# Patient Record
Sex: Female | Born: 1955
Health system: Southern US, Community
[De-identification: ages and names within clinical notes are randomized; demographics above are authoritative.]

## PROBLEM LIST (undated history)

## (undated) DIAGNOSIS — K631 Perforation of intestine (nontraumatic): Secondary | ICD-10-CM

## (undated) DIAGNOSIS — C801 Malignant (primary) neoplasm, unspecified: Secondary | ICD-10-CM

## (undated) DIAGNOSIS — J45909 Unspecified asthma, uncomplicated: Secondary | ICD-10-CM

## (undated) DIAGNOSIS — T7840XA Allergy, unspecified, initial encounter: Secondary | ICD-10-CM

## (undated) DIAGNOSIS — M8580 Other specified disorders of bone density and structure, unspecified site: Secondary | ICD-10-CM

## (undated) HISTORY — DX: Perforation of intestine (nontraumatic): K63.1

## (undated) HISTORY — PX: LUNG REMOVAL, PARTIAL: SHX233

## (undated) HISTORY — DX: Other specified disorders of bone density and structure, unspecified site: M85.80

## (undated) HISTORY — DX: Allergy, unspecified, initial encounter: T78.40XA

## (undated) HISTORY — PX: DILATION AND CURETTAGE OF UTERUS: SHX78

## (undated) HISTORY — DX: Malignant (primary) neoplasm, unspecified: C80.1

## (undated) HISTORY — PX: COLON SURGERY: SHX602

## (undated) HISTORY — PX: APPENDECTOMY: SHX54

## (undated) HISTORY — DX: Unspecified asthma, uncomplicated: J45.909

## (undated) HISTORY — PX: COLON RESECTION: SHX5231

---

## 1999-12-21 ENCOUNTER — Encounter: Admission: RE | Admit: 1999-12-21 | Discharge: 1999-12-21 | Payer: Self-pay | Admitting: Oncology

## 1999-12-21 ENCOUNTER — Encounter: Payer: Self-pay | Admitting: Oncology

## 2011-09-12 DIAGNOSIS — C20 Malignant neoplasm of rectum: Secondary | ICD-10-CM | POA: Insufficient documentation

## 2011-09-12 DIAGNOSIS — K635 Polyp of colon: Secondary | ICD-10-CM | POA: Insufficient documentation

## 2013-01-20 DIAGNOSIS — N952 Postmenopausal atrophic vaginitis: Secondary | ICD-10-CM | POA: Insufficient documentation

## 2013-08-19 DIAGNOSIS — C7800 Secondary malignant neoplasm of unspecified lung: Secondary | ICD-10-CM | POA: Insufficient documentation

## 2015-12-29 DIAGNOSIS — D126 Benign neoplasm of colon, unspecified: Secondary | ICD-10-CM | POA: Diagnosis not present

## 2015-12-29 DIAGNOSIS — C20 Malignant neoplasm of rectum: Secondary | ICD-10-CM | POA: Diagnosis not present

## 2015-12-29 DIAGNOSIS — W888XXS Exposure to other ionizing radiation, sequela: Secondary | ICD-10-CM | POA: Diagnosis not present

## 2015-12-29 DIAGNOSIS — Z1211 Encounter for screening for malignant neoplasm of colon: Secondary | ICD-10-CM | POA: Diagnosis not present

## 2015-12-29 DIAGNOSIS — K635 Polyp of colon: Secondary | ICD-10-CM | POA: Diagnosis not present

## 2015-12-29 DIAGNOSIS — Z85038 Personal history of other malignant neoplasm of large intestine: Secondary | ICD-10-CM | POA: Diagnosis not present

## 2015-12-29 DIAGNOSIS — D123 Benign neoplasm of transverse colon: Secondary | ICD-10-CM | POA: Diagnosis not present

## 2015-12-29 DIAGNOSIS — Z86010 Personal history of colonic polyps: Secondary | ICD-10-CM | POA: Diagnosis not present

## 2015-12-29 DIAGNOSIS — Z79899 Other long term (current) drug therapy: Secondary | ICD-10-CM | POA: Diagnosis not present

## 2015-12-29 DIAGNOSIS — K520 Gastroenteritis and colitis due to radiation: Secondary | ICD-10-CM | POA: Diagnosis not present

## 2016-01-04 DIAGNOSIS — R05 Cough: Secondary | ICD-10-CM | POA: Diagnosis not present

## 2016-01-04 DIAGNOSIS — J302 Other seasonal allergic rhinitis: Secondary | ICD-10-CM | POA: Diagnosis not present

## 2016-01-04 DIAGNOSIS — J019 Acute sinusitis, unspecified: Secondary | ICD-10-CM | POA: Diagnosis not present

## 2016-01-04 DIAGNOSIS — Z6824 Body mass index (BMI) 24.0-24.9, adult: Secondary | ICD-10-CM | POA: Diagnosis not present

## 2016-01-11 DIAGNOSIS — R05 Cough: Secondary | ICD-10-CM | POA: Diagnosis not present

## 2016-01-11 DIAGNOSIS — J309 Allergic rhinitis, unspecified: Secondary | ICD-10-CM | POA: Diagnosis not present

## 2016-01-11 DIAGNOSIS — Z1389 Encounter for screening for other disorder: Secondary | ICD-10-CM | POA: Diagnosis not present

## 2016-01-11 DIAGNOSIS — Z6825 Body mass index (BMI) 25.0-25.9, adult: Secondary | ICD-10-CM | POA: Diagnosis not present

## 2016-01-11 DIAGNOSIS — J302 Other seasonal allergic rhinitis: Secondary | ICD-10-CM | POA: Diagnosis not present

## 2016-01-17 DIAGNOSIS — R05 Cough: Secondary | ICD-10-CM | POA: Diagnosis not present

## 2016-01-17 DIAGNOSIS — Z1389 Encounter for screening for other disorder: Secondary | ICD-10-CM | POA: Diagnosis not present

## 2016-01-17 DIAGNOSIS — Z6825 Body mass index (BMI) 25.0-25.9, adult: Secondary | ICD-10-CM | POA: Diagnosis not present

## 2016-01-18 DIAGNOSIS — M5416 Radiculopathy, lumbar region: Secondary | ICD-10-CM | POA: Diagnosis not present

## 2016-01-18 DIAGNOSIS — M9905 Segmental and somatic dysfunction of pelvic region: Secondary | ICD-10-CM | POA: Diagnosis not present

## 2016-01-18 DIAGNOSIS — M9902 Segmental and somatic dysfunction of thoracic region: Secondary | ICD-10-CM | POA: Diagnosis not present

## 2016-01-18 DIAGNOSIS — M9903 Segmental and somatic dysfunction of lumbar region: Secondary | ICD-10-CM | POA: Diagnosis not present

## 2016-02-03 DIAGNOSIS — R05 Cough: Secondary | ICD-10-CM | POA: Diagnosis not present

## 2016-02-07 DIAGNOSIS — Z201 Contact with and (suspected) exposure to tuberculosis: Secondary | ICD-10-CM | POA: Diagnosis not present

## 2016-02-24 DIAGNOSIS — M9905 Segmental and somatic dysfunction of pelvic region: Secondary | ICD-10-CM | POA: Diagnosis not present

## 2016-02-24 DIAGNOSIS — M5416 Radiculopathy, lumbar region: Secondary | ICD-10-CM | POA: Diagnosis not present

## 2016-02-24 DIAGNOSIS — M9902 Segmental and somatic dysfunction of thoracic region: Secondary | ICD-10-CM | POA: Diagnosis not present

## 2016-02-24 DIAGNOSIS — M9903 Segmental and somatic dysfunction of lumbar region: Secondary | ICD-10-CM | POA: Diagnosis not present

## 2016-03-10 DIAGNOSIS — Z1231 Encounter for screening mammogram for malignant neoplasm of breast: Secondary | ICD-10-CM | POA: Diagnosis not present

## 2016-03-10 DIAGNOSIS — H5213 Myopia, bilateral: Secondary | ICD-10-CM | POA: Diagnosis not present

## 2016-04-13 DIAGNOSIS — Z79899 Other long term (current) drug therapy: Secondary | ICD-10-CM | POA: Diagnosis not present

## 2016-04-13 DIAGNOSIS — R05 Cough: Secondary | ICD-10-CM | POA: Diagnosis not present

## 2016-04-14 DIAGNOSIS — R05 Cough: Secondary | ICD-10-CM | POA: Diagnosis not present

## 2016-04-18 DIAGNOSIS — M9905 Segmental and somatic dysfunction of pelvic region: Secondary | ICD-10-CM | POA: Diagnosis not present

## 2016-04-18 DIAGNOSIS — M5416 Radiculopathy, lumbar region: Secondary | ICD-10-CM | POA: Diagnosis not present

## 2016-04-18 DIAGNOSIS — M9902 Segmental and somatic dysfunction of thoracic region: Secondary | ICD-10-CM | POA: Diagnosis not present

## 2016-04-18 DIAGNOSIS — M9903 Segmental and somatic dysfunction of lumbar region: Secondary | ICD-10-CM | POA: Diagnosis not present

## 2016-05-23 DIAGNOSIS — M9903 Segmental and somatic dysfunction of lumbar region: Secondary | ICD-10-CM | POA: Diagnosis not present

## 2016-05-23 DIAGNOSIS — M9902 Segmental and somatic dysfunction of thoracic region: Secondary | ICD-10-CM | POA: Diagnosis not present

## 2016-05-23 DIAGNOSIS — M9905 Segmental and somatic dysfunction of pelvic region: Secondary | ICD-10-CM | POA: Diagnosis not present

## 2016-05-23 DIAGNOSIS — M5416 Radiculopathy, lumbar region: Secondary | ICD-10-CM | POA: Diagnosis not present

## 2016-05-25 DIAGNOSIS — R05 Cough: Secondary | ICD-10-CM | POA: Diagnosis not present

## 2016-05-25 DIAGNOSIS — R053 Chronic cough: Secondary | ICD-10-CM | POA: Insufficient documentation

## 2016-06-13 DIAGNOSIS — M5416 Radiculopathy, lumbar region: Secondary | ICD-10-CM | POA: Diagnosis not present

## 2016-06-13 DIAGNOSIS — M9905 Segmental and somatic dysfunction of pelvic region: Secondary | ICD-10-CM | POA: Diagnosis not present

## 2016-06-13 DIAGNOSIS — M9902 Segmental and somatic dysfunction of thoracic region: Secondary | ICD-10-CM | POA: Diagnosis not present

## 2016-06-13 DIAGNOSIS — M9903 Segmental and somatic dysfunction of lumbar region: Secondary | ICD-10-CM | POA: Diagnosis not present

## 2016-07-07 DIAGNOSIS — Z23 Encounter for immunization: Secondary | ICD-10-CM | POA: Diagnosis not present

## 2016-07-10 DIAGNOSIS — R5383 Other fatigue: Secondary | ICD-10-CM | POA: Diagnosis not present

## 2016-07-10 DIAGNOSIS — R05 Cough: Secondary | ICD-10-CM | POA: Diagnosis not present

## 2016-07-10 DIAGNOSIS — J383 Other diseases of vocal cords: Secondary | ICD-10-CM | POA: Diagnosis not present

## 2016-07-10 DIAGNOSIS — R490 Dysphonia: Secondary | ICD-10-CM | POA: Diagnosis not present

## 2016-07-25 DIAGNOSIS — M9905 Segmental and somatic dysfunction of pelvic region: Secondary | ICD-10-CM | POA: Diagnosis not present

## 2016-07-25 DIAGNOSIS — M9902 Segmental and somatic dysfunction of thoracic region: Secondary | ICD-10-CM | POA: Diagnosis not present

## 2016-07-25 DIAGNOSIS — M5416 Radiculopathy, lumbar region: Secondary | ICD-10-CM | POA: Diagnosis not present

## 2016-07-25 DIAGNOSIS — M9903 Segmental and somatic dysfunction of lumbar region: Secondary | ICD-10-CM | POA: Diagnosis not present

## 2016-08-02 DIAGNOSIS — R490 Dysphonia: Secondary | ICD-10-CM | POA: Diagnosis not present

## 2016-08-02 DIAGNOSIS — J383 Other diseases of vocal cords: Secondary | ICD-10-CM | POA: Diagnosis not present

## 2016-08-22 DIAGNOSIS — R490 Dysphonia: Secondary | ICD-10-CM | POA: Diagnosis not present

## 2016-08-22 DIAGNOSIS — J383 Other diseases of vocal cords: Secondary | ICD-10-CM | POA: Diagnosis not present

## 2016-09-05 DIAGNOSIS — M9902 Segmental and somatic dysfunction of thoracic region: Secondary | ICD-10-CM | POA: Diagnosis not present

## 2016-09-05 DIAGNOSIS — M5416 Radiculopathy, lumbar region: Secondary | ICD-10-CM | POA: Diagnosis not present

## 2016-09-05 DIAGNOSIS — M9903 Segmental and somatic dysfunction of lumbar region: Secondary | ICD-10-CM | POA: Diagnosis not present

## 2016-09-05 DIAGNOSIS — M9905 Segmental and somatic dysfunction of pelvic region: Secondary | ICD-10-CM | POA: Diagnosis not present

## 2016-11-02 DIAGNOSIS — M9902 Segmental and somatic dysfunction of thoracic region: Secondary | ICD-10-CM | POA: Diagnosis not present

## 2016-11-02 DIAGNOSIS — M5416 Radiculopathy, lumbar region: Secondary | ICD-10-CM | POA: Diagnosis not present

## 2016-11-02 DIAGNOSIS — M9903 Segmental and somatic dysfunction of lumbar region: Secondary | ICD-10-CM | POA: Diagnosis not present

## 2016-11-02 DIAGNOSIS — M9905 Segmental and somatic dysfunction of pelvic region: Secondary | ICD-10-CM | POA: Diagnosis not present

## 2016-11-22 DIAGNOSIS — R0602 Shortness of breath: Secondary | ICD-10-CM | POA: Diagnosis not present

## 2016-11-22 DIAGNOSIS — J45909 Unspecified asthma, uncomplicated: Secondary | ICD-10-CM | POA: Diagnosis not present

## 2016-11-22 DIAGNOSIS — Z79899 Other long term (current) drug therapy: Secondary | ICD-10-CM | POA: Diagnosis not present

## 2016-11-22 DIAGNOSIS — R490 Dysphonia: Secondary | ICD-10-CM | POA: Diagnosis not present

## 2016-11-22 DIAGNOSIS — R05 Cough: Secondary | ICD-10-CM | POA: Diagnosis not present

## 2016-11-28 DIAGNOSIS — M9905 Segmental and somatic dysfunction of pelvic region: Secondary | ICD-10-CM | POA: Diagnosis not present

## 2016-11-28 DIAGNOSIS — M9902 Segmental and somatic dysfunction of thoracic region: Secondary | ICD-10-CM | POA: Diagnosis not present

## 2016-11-28 DIAGNOSIS — M9903 Segmental and somatic dysfunction of lumbar region: Secondary | ICD-10-CM | POA: Diagnosis not present

## 2016-11-28 DIAGNOSIS — M545 Low back pain: Secondary | ICD-10-CM | POA: Diagnosis not present

## 2016-12-14 DIAGNOSIS — M9903 Segmental and somatic dysfunction of lumbar region: Secondary | ICD-10-CM | POA: Diagnosis not present

## 2016-12-14 DIAGNOSIS — M9905 Segmental and somatic dysfunction of pelvic region: Secondary | ICD-10-CM | POA: Diagnosis not present

## 2016-12-14 DIAGNOSIS — M545 Low back pain: Secondary | ICD-10-CM | POA: Diagnosis not present

## 2016-12-14 DIAGNOSIS — M9902 Segmental and somatic dysfunction of thoracic region: Secondary | ICD-10-CM | POA: Diagnosis not present

## 2016-12-21 DIAGNOSIS — M545 Low back pain: Secondary | ICD-10-CM | POA: Diagnosis not present

## 2016-12-21 DIAGNOSIS — M9905 Segmental and somatic dysfunction of pelvic region: Secondary | ICD-10-CM | POA: Diagnosis not present

## 2016-12-21 DIAGNOSIS — M9903 Segmental and somatic dysfunction of lumbar region: Secondary | ICD-10-CM | POA: Diagnosis not present

## 2016-12-21 DIAGNOSIS — M9902 Segmental and somatic dysfunction of thoracic region: Secondary | ICD-10-CM | POA: Diagnosis not present

## 2017-01-02 DIAGNOSIS — M9903 Segmental and somatic dysfunction of lumbar region: Secondary | ICD-10-CM | POA: Diagnosis not present

## 2017-01-02 DIAGNOSIS — M545 Low back pain: Secondary | ICD-10-CM | POA: Diagnosis not present

## 2017-01-02 DIAGNOSIS — M9905 Segmental and somatic dysfunction of pelvic region: Secondary | ICD-10-CM | POA: Diagnosis not present

## 2017-01-02 DIAGNOSIS — M9902 Segmental and somatic dysfunction of thoracic region: Secondary | ICD-10-CM | POA: Diagnosis not present

## 2017-01-15 ENCOUNTER — Ambulatory Visit (INDEPENDENT_AMBULATORY_CARE_PROVIDER_SITE_OTHER): Payer: BLUE CROSS/BLUE SHIELD | Admitting: Family

## 2017-01-15 ENCOUNTER — Encounter: Payer: Self-pay | Admitting: Family

## 2017-01-15 ENCOUNTER — Other Ambulatory Visit (INDEPENDENT_AMBULATORY_CARE_PROVIDER_SITE_OTHER): Payer: BLUE CROSS/BLUE SHIELD

## 2017-01-15 VITALS — BP 112/78 | HR 72 | Temp 98.3°F | Resp 16 | Ht 68.0 in | Wt 170.8 lb

## 2017-01-15 DIAGNOSIS — Z124 Encounter for screening for malignant neoplasm of cervix: Secondary | ICD-10-CM

## 2017-01-15 DIAGNOSIS — Z7289 Other problems related to lifestyle: Secondary | ICD-10-CM

## 2017-01-15 DIAGNOSIS — Z0000 Encounter for general adult medical examination without abnormal findings: Secondary | ICD-10-CM | POA: Insufficient documentation

## 2017-01-15 DIAGNOSIS — Z1231 Encounter for screening mammogram for malignant neoplasm of breast: Secondary | ICD-10-CM | POA: Diagnosis not present

## 2017-01-15 LAB — LIPID PANEL
Cholesterol: 189 mg/dL (ref 0–200)
HDL: 83.9 mg/dL (ref >39.00)
LDL Cholesterol: 87 mg/dL (ref 0–99)
NonHDL: 104.7
Total CHOL/HDL Ratio: 2
Triglycerides: 89 mg/dL (ref 0.0–149.0)
VLDL: 17.8 mg/dL (ref 0.0–40.0)

## 2017-01-15 LAB — CBC
HCT: 44.1 % (ref 36.0–46.0)
Hemoglobin: 15 g/dL (ref 12.0–15.0)
MCHC: 34 g/dL (ref 30.0–36.0)
MCV: 97.3 fl (ref 78.0–100.0)
Platelets: 176 10*3/uL (ref 150.0–400.0)
RBC: 4.53 Mil/uL (ref 3.87–5.11)
RDW: 12.9 % (ref 11.5–15.5)
WBC: 4.5 10*3/uL (ref 4.0–10.5)

## 2017-01-15 LAB — COMPREHENSIVE METABOLIC PANEL
ALT: 23 U/L (ref 0–35)
AST: 20 U/L (ref 0–37)
Albumin: 4.3 g/dL (ref 3.5–5.2)
Alkaline Phosphatase: 88 U/L (ref 39–117)
BUN: 11 mg/dL (ref 6–23)
CO2: 28 mEq/L (ref 19–32)
Calcium: 9.7 mg/dL (ref 8.4–10.5)
Chloride: 107 mEq/L (ref 96–112)
Creatinine, Ser: 0.81 mg/dL (ref 0.40–1.20)
GFR: 75.95 mL/min (ref >60.00)
Glucose, Bld: 93 mg/dL (ref 70–99)
Potassium: 4.7 mEq/L (ref 3.5–5.1)
Sodium: 140 mEq/L (ref 135–145)
Total Bilirubin: 0.7 mg/dL (ref 0.2–1.2)
Total Protein: 6.9 g/dL (ref 6.0–8.3)

## 2017-01-15 NOTE — Assessment & Plan Note (Signed)
1) Anticipatory Guidance: Discussed importance of wearing a seatbelt while driving and not texting while driving; changing batteries in smoke detector at least once annually; wearing suntan lotion when outside; eating a balanced and moderate diet; getting physical activity at least 30 minutes per day.  2) Immunizations / Screenings / Labs:  Declines tetanus. All other immunizations are up-to-date per recommendations. Due for cervical cancer screening with referral to gynecology placed. Obtain hepatitis C antibody for hepatitis C screening. Due for breast cancer screening with order for mammogram placed. Previous history of rectal cancer with colonoscopies managed by [facility] gastroenterology and currently maintained on every 3 year schedule. All other screenings are up-to-date per recommendations. Obtain CBC, CMET, and lipid profile.    Overall well exam with risk factors for cardiovascular disease being minimal at this time. Nutritional intake is moderate, balance, and varied. She exercises regularly. Cancers currently in remission and being monitored through [facility] gastroenterology. Continue other healthy lifestyle behaviors and choices. Follow-up prevention exam in 1 year. Follow-up office visit pending blood work as needed.

## 2017-01-15 NOTE — Patient Instructions (Addendum)
Thank you for choosing [facility].  SUMMARY AND INSTRUCTIONS:  Nice to meet you!  They will call with your referral to Gynecology and for your mammogram.   Medication:  Please continue to take your medications as prescribed.   Labs:  Please stop by the lab on the lower level of the building for your blood work. Your results will be released to [place] (or called to you) after review, usually within 72 hours after test completion. If any changes need to be made, you will be notified at that same time.  1.) The lab is open from 7:30am to 5:30 pm Monday-Friday 2.) No appointment is necessary 3.) Fasting (if needed) is 6-8 hours after food and drink; black coffee and water are okay   Follow up:  If your symptoms worsen or fail to improve, please contact our office for further instruction, or in case of emergency go directly to the emergency room at the closest medical facility.    Health Maintenance, Female Adopting a healthy lifestyle and getting preventive care can go a long way to promote health and wellness. Talk with your health care provider about what schedule of regular examinations is right for you. This is a good chance for you to check in with your provider about disease prevention and staying healthy. In between checkups, there are plenty of things you can do on your own. Experts have done a lot of research about which lifestyle changes and preventive measures are most likely to keep you healthy. Ask your health care provider for more information. Weight and diet Eat a healthy diet  Be sure to include plenty of vegetables, fruits, low-fat dairy products, and lean protein.  Do not eat a lot of foods high in solid fats, added sugars, or salt.  Get regular exercise. This is one of the most important things you can do for your health.  Most adults should exercise for at least 150 minutes each week. The exercise should increase your heart rate and make you sweat  (moderate-intensity exercise).  Most adults should also do strengthening exercises at least twice a week. This is in addition to the moderate-intensity exercise. Maintain a healthy weight  Body mass index (BMI) is a measurement that can be used to identify possible weight problems. It estimates body fat based on height and weight. Your health care provider can help determine your BMI and help you achieve or maintain a healthy weight.  For females 28 years of age and older:  A BMI below 18.5 is considered underweight.  A BMI of 18.5 to 24.9 is normal.  A BMI of 25 to 29.9 is considered overweight.  A BMI of 30 and above is considered obese. Watch levels of cholesterol and blood lipids  You should start having your blood tested for lipids and cholesterol at 63 years of age, then have this test every 5 years.  You may need to have your cholesterol levels checked more often if:  Your lipid or cholesterol levels are high.  You are older than 63 years of age.  You are at high risk for heart disease. Cancer screening Lung Cancer  Lung cancer screening is recommended for adults 63-47 years old who are at high risk for lung cancer because of a history of smoking.  A yearly low-dose CT scan of the lungs is recommended for people who:  Currently smoke.  Have quit within the past 15 years.  Have at least a 30-pack-year history of smoking. A pack year  is smoking an average of one pack of cigarettes a day for 1 year.  Yearly screening should continue until it has been 15 years since you quit.  Yearly screening should stop if you develop a health problem that would prevent you from having lung cancer treatment. Breast Cancer  Practice breast self-awareness. This means understanding how your breasts normally appear and feel.  It also means doing regular breast self-exams. Let your health care provider know about any changes, no matter how small.  If you are in your 63s or 30s, you  should have a clinical breast exam (CBE) by a health care provider every 63-3 years as part of a regular health exam.  If you are 63 or older, have a CBE every year. Also consider having a breast X-ray (mammogram) every year.  If you have a family history of breast cancer, talk to your health care provider about genetic screening.  If you are at high risk for breast cancer, talk to your health care provider about having an MRI and a mammogram every year.  Breast cancer gene (BRCA) assessment is recommended for women who have family members with BRCA-related cancers. BRCA-related cancers include:  Breast.  Ovarian.  Tubal.  Peritoneal cancers.  Results of the assessment will determine the need for genetic counseling and BRCA1 and BRCA2 testing. Cervical Cancer  Your health care provider may recommend that you be screened regularly for cancer of the pelvic organs (ovaries, uterus, and vagina). This screening involves a pelvic examination, including checking for microscopic changes to the surface of your cervix (Pap test). You may be encouraged to have this screening done every 3 years, beginning at age 63.  For women ages 63-65, health care providers may recommend pelvic exams and Pap testing every 3 years, or they may recommend the Pap and pelvic exam, combined with testing for human papilloma virus (HPV), every 5 years. Some types of HPV increase your risk of cervical cancer. Testing for HPV may also be done on women of any age with unclear Pap test results.  Other health care providers may not recommend any screening for nonpregnant women who are considered low risk for pelvic cancer and who do not have symptoms. Ask your health care provider if a screening pelvic exam is right for you.  If you have had past treatment for cervical cancer or a condition that could lead to cancer, you need Pap tests and screening for cancer for at least 20 years after your treatment. If Pap tests have been  discontinued, your risk factors (such as having a new sexual partner) need to be reassessed to determine if screening should resume. Some women have medical problems that increase the chance of getting cervical cancer. In these cases, your health care provider may recommend more frequent screening and Pap tests. Colorectal Cancer  This type of cancer can be detected and often prevented.  Routine colorectal cancer screening usually begins at 63 years of age and continues through 63 years of age.  Your health care provider may recommend screening at an earlier age if you have risk factors for colon cancer.  Your health care provider may also recommend using home test kits to check for hidden blood in the stool.  A small camera at the end of a tube can be used to examine your colon directly (sigmoidoscopy or colonoscopy). This is done to check for the earliest forms of colorectal cancer.  Routine screening usually begins at age 63.  Direct examination of  the colon should be repeated every 5-10 years through 63 years of age. However, you may need to be screened more often if early forms of precancerous polyps or small growths are found. Skin Cancer  Check your skin from head to toe regularly.  Tell your health care provider about any new moles or changes in moles, especially if there is a change in a mole's shape or color.  Also tell your health care provider if you have a mole that is larger than the size of a pencil eraser.  Always use sunscreen. Apply sunscreen liberally and repeatedly throughout the day.  Protect yourself by wearing long sleeves, pants, a wide-brimmed hat, and sunglasses whenever you are outside. Heart disease, diabetes, and high blood pressure  High blood pressure causes heart disease and increases the risk of stroke. High blood pressure is more likely to develop in:  People who have blood pressure in the high end of the normal range (130-139/85-89 mm Hg).  People  who are overweight or obese.  People who are African American.  If you are 35-41 years of age, have your blood pressure checked every 3-5 years. If you are 32 years of age or older, have your blood pressure checked every year. You should have your blood pressure measured twice-once when you are at a hospital or clinic, and once when you are not at a hospital or clinic. Record the average of the two measurements. To check your blood pressure when you are not at a hospital or clinic, you can use:  An automated blood pressure machine at a pharmacy.  A home blood pressure monitor.  If you are between 39 years and 10 years old, ask your health care provider if you should take aspirin to prevent strokes.  Have regular diabetes screenings. This involves taking a blood sample to check your fasting blood sugar level.  If you are at a normal weight and have a low risk for diabetes, have this test once every three years after 63 years of age.  If you are overweight and have a high risk for diabetes, consider being tested at a younger age or more often. Preventing infection Hepatitis B  If you have a higher risk for hepatitis B, you should be screened for this virus. You are considered at high risk for hepatitis B if:  You were born in a country where hepatitis B is common. Ask your health care provider which countries are considered high risk.  Your parents were born in a high-risk country, and you have not been immunized against hepatitis B (hepatitis B vaccine).  You have HIV or AIDS.  You use needles to inject street drugs.  You live with someone who has hepatitis B.  You have had sex with someone who has hepatitis B.  You get hemodialysis treatment.  You take certain medicines for conditions, including cancer, organ transplantation, and autoimmune conditions. Hepatitis C  Blood testing is recommended for:  Everyone born from [birth year] through 1967.  Anyone with known risk factors for  hepatitis C. Sexually transmitted infections (STIs)  You should be screened for sexually transmitted infections (STIs) including gonorrhea and chlamydia if:  You are sexually active and are younger than 63 years of age.  You are older than 63 years of age and your health care provider tells you that you are at risk for this type of infection.  Your sexual activity has changed since you were last screened and you are at an increased risk for  chlamydia or gonorrhea. Ask your health care provider if you are at risk.  If you do not have HIV, but are at risk, it may be recommended that you take a prescription medicine daily to prevent HIV infection. This is called pre-exposure prophylaxis (PrEP). You are considered at risk if:  You are sexually active and do not regularly use condoms or know the HIV status of your partner(s).  You take drugs by injection.  You are sexually active with a partner who has HIV. Talk with your health care provider about whether you are at high risk of being infected with HIV. If you choose to begin PrEP, you should first be tested for HIV. You should then be tested every 3 months for as long as you are taking PrEP. Pregnancy  If you are premenopausal and you may become pregnant, ask your health care provider about preconception counseling.  If you may become pregnant, take 400 to 800 micrograms (mcg) of folic acid every day.  If you want to prevent pregnancy, talk to your health care provider about birth control (contraception). Osteoporosis and menopause  Osteoporosis is a disease in which the bones lose minerals and strength with aging. This can result in serious bone fractures. Your risk for osteoporosis can be identified using a bone density scan.  If you are 90 years of age or older, or if you are at risk for osteoporosis and fractures, ask your health care provider if you should be screened.  Ask your health care provider whether you should take a calcium  or vitamin D supplement to lower your risk for osteoporosis.  Menopause may have certain physical symptoms and risks.  Hormone replacement therapy may reduce some of these symptoms and risks. Talk to your health care provider about whether hormone replacement therapy is right for you. Follow these instructions at home:  Schedule regular health, dental, and eye exams.  Stay current with your immunizations.  Do not use any tobacco products including cigarettes, chewing tobacco, or electronic cigarettes.  If you are pregnant, do not drink alcohol.  If you are breastfeeding, limit how much and how often you drink alcohol.  Limit alcohol intake to no more than 1 drink per day for nonpregnant women. One drink equals 12 ounces of beer, 5 ounces of wine, or 1 ounces of hard liquor.  Do not use street drugs.  Do not share needles.  Ask your health care provider for help if you need support or information about quitting drugs.  Tell your health care provider if you often feel depressed.  Tell your health care provider if you have ever been abused or do not feel safe at home. This information is not intended to replace advice given to you by your health care provider. Make sure you discuss any questions you have with your health care provider. Document Released: 03/27/2011 Document Revised: 02/17/2016 Document Reviewed: 06/15/2015 Elsevier Interactive Patient Education  2017 [employer].

## 2017-01-15 NOTE — Progress Notes (Signed)
Subjective:    Patient ID: Angelica Sellers, female    DOB: May 20, 1956, 61 y.o.   MRN: [id number]  Chief Complaint  Patient presents with  . Establish Care    CPE, fasting    HPI:  Angelica Sellers is a 61 y.o. female who presents today for an annual wellness visit.   1) Health Maintenance -   Diet - Averages about 2-3 meals per day consisting of a regular diet; Caffeine intake of about 1-2 cups daily  Exercise - Walks daily; has had some difficulty secondary to asthma; generally 2-3 miles per day   2) Preventative Exams / Immunizations:  Dental -- Up to date  Vision -- Up to date   Health Maintenance  Topic Date Due  . Hepatitis C Screening  1956/09/25  . HIV Screening  03/19/1969  . TETANUS/TDAP  03/19/1973  . PAP SMEAR  03/20/1975  . MAMMOGRAM  03/19/2004  . INFLUENZA VACCINE  04/25/2017  . COLONOSCOPY  12/08/2018     There is no immunization history on file for this patient.   No Known Allergies   No outpatient prescriptions prior to visit.   No facility-administered medications prior to visit.      Past Medical History:  Diagnosis Date  . Allergy   . Asthma   . Cancer [facility])    Rectal cancer with lung met - in remission x 18 years.     Past Surgical History:  Procedure Laterality Date  . APPENDECTOMY    . COLON RESECTION    . LUNG REMOVAL, PARTIAL       Family History  Problem Relation Age of Onset  . COPD Mother   . Rectal cancer Father   . Breast cancer Maternal Grandmother      Social History   Social History  . Marital status: Married    Spouse name: N/A  . Number of children: 1  . Years of education: 67   Occupational History  . Theatre stage manager    Social History Main Topics  . Smoking status: Former Smoker    Packs/day: 0.10    Years: 2.00  . Smokeless tobacco: Never Used  . Alcohol use 0.6 oz/week    1 Glasses of wine per week  . Drug use: No  . Sexual activity: Not on file   Other Topics Concern  . Not on file    Social History Narrative   Fun/Hobby: Travel, read, walks for exercise   Denies abuse and feels safe at home       Review of Systems  Constitutional: Denies fever, chills, fatigue, or significant weight gain/loss. HENT: Head: Denies headache or neck pain Ears: Denies changes in hearing, ringing in ears, earache, drainage Nose: Denies discharge, stuffiness, itching, nosebleed, sinus pain Throat: Denies sore throat, hoarseness, dry mouth, sores, thrush Eyes: Denies loss/changes in vision, pain, redness, blurry/double vision, flashing lights Cardiovascular: Denies chest pain/discomfort, tightness, palpitations, shortness of breath with activity, difficulty lying down, swelling, sudden awakening with shortness of breath Respiratory: Denies shortness of breath, cough, sputum production, wheezing Gastrointestinal: Denies dysphasia, heartburn, change in appetite, nausea, change in bowel habits, rectal bleeding, constipation, diarrhea, yellow skin or eyes Genitourinary: Denies frequency, urgency, burning/pain, blood in urine, incontinence, change in urinary strength. Musculoskeletal: Denies muscle/joint pain, stiffness, back pain, redness or swelling of joints, trauma Skin: Denies rashes, lumps, itching, dryness, color changes, or hair/nail changes Neurological: Denies dizziness, fainting, seizures, weakness, numbness, tingling, tremor Psychiatric - Denies nervousness, stress, depression or  memory loss Endocrine: Denies heat or cold intolerance, sweating, frequent urination, excessive thirst, changes in appetite Hematologic: Denies ease of bruising or bleeding     Objective:     BP 112/78 (BP Location: Left Arm, Patient Position: Sitting, Cuff Size: Normal)   Pulse 72   Temp 98.3 F (36.8 C) (Oral)   Resp 16   Ht 5' 8"  (1.727 m)   Wt 170 lb 12.8 oz (77.5 kg)   SpO2 96%   BMI 25.97 kg/m  Nursing note and vital signs reviewed.  Physical Exam  Constitutional: She is oriented to  person, place, and time. She appears well-developed and well-nourished.  HENT:  Head: Normocephalic.  Right Ear: Hearing, tympanic membrane, external ear and ear canal normal.  Left Ear: Hearing, tympanic membrane, external ear and ear canal normal.  Nose: Nose normal.  Mouth/Throat: Uvula is midline, oropharynx is clear and moist and mucous membranes are normal.  Eyes: Conjunctivae and EOM are normal. Pupils are equal, round, and reactive to light.  Neck: Neck supple. No JVD present. No tracheal deviation present. No thyromegaly present.  Cardiovascular: Normal rate, regular rhythm, normal heart sounds and intact distal pulses.   Pulmonary/Chest: Effort normal and breath sounds normal.  Abdominal: Soft. Bowel sounds are normal. She exhibits no distension and no mass. There is no tenderness. There is no rebound and no guarding.  Musculoskeletal: Normal range of motion. She exhibits no edema or tenderness.  Lymphadenopathy:    She has no cervical adenopathy.  Neurological: She is alert and oriented to person, place, and time. She has normal reflexes. No cranial nerve deficit. She exhibits normal muscle tone. Coordination normal.  Skin: Skin is warm and dry.  Psychiatric: She has a normal mood and affect. Her behavior is normal. Judgment and thought content normal.       Assessment & Plan:   Problem List Items Addressed This Visit      Other   Cervical cancer screening   Relevant Orders   Ambulatory referral to Gynecology   Routine adult health maintenance - Primary    1) Anticipatory Guidance: Discussed importance of wearing a seatbelt while driving and not texting while driving; changing batteries in smoke detector at least once annually; wearing suntan lotion when outside; eating a balanced and moderate diet; getting physical activity at least 30 minutes per day.  2) Immunizations / Screenings / Labs:  Declines tetanus. All other immunizations are up-to-date per recommendations.  Due for cervical cancer screening with referral to gynecology placed. Obtain hepatitis C antibody for hepatitis C screening. Due for breast cancer screening with order for mammogram placed. Previous history of rectal cancer with colonoscopies managed by [facility] gastroenterology and currently maintained on every 3 year schedule. All other screenings are up-to-date per recommendations. Obtain CBC, CMET, and lipid profile.    Overall well exam with risk factors for cardiovascular disease being minimal at this time. Nutritional intake is moderate, balance, and varied. She exercises regularly. Cancers currently in remission and being monitored through [facility] gastroenterology. Continue other healthy lifestyle behaviors and choices. Follow-up prevention exam in 1 year. Follow-up office visit pending blood work as needed.       Relevant Orders   CBC (Completed)   Comprehensive metabolic panel (Completed)   Lipid panel (Completed)    Other Visit Diagnoses    Other problems related to lifestyle       Relevant Orders   Hepatitis C Antibody   Encounter for screening mammogram for breast cancer  Relevant Orders   MM DIGITAL SCREENING BILATERAL       I am having Ms. Aronov maintain her Fluticasone-Salmeterol, montelukast, fluticasone, and levocetirizine.   Meds ordered this encounter  Medications  . Fluticasone-Salmeterol (ADVAIR) 250-50 MCG/DOSE AEPB    Sig: Inhale 1 puff into the lungs 2 (two) times daily.  . montelukast (SINGULAIR) 10 MG tablet    Sig: Take 10 mg by mouth at bedtime.  . fluticasone (FLONASE) 50 MCG/ACT nasal spray    Sig: Place 2 sprays into both nostrils daily.  Marland Kitchen levocetirizine (XYZAL) 2.5 MG/5ML solution    Sig: Take 2.5 mg by mouth every evening.     Follow-up: Return in about 1 year (around 01/15/2018), or if symptoms worsen or fail to improve.   Mauricio Po, FNP

## 2017-01-16 ENCOUNTER — Encounter: Payer: Self-pay | Admitting: Family

## 2017-01-16 LAB — HEPATITIS C ANTIBODY: HCV Ab: NEGATIVE

## 2017-01-23 DIAGNOSIS — M9902 Segmental and somatic dysfunction of thoracic region: Secondary | ICD-10-CM | POA: Diagnosis not present

## 2017-01-23 DIAGNOSIS — M9905 Segmental and somatic dysfunction of pelvic region: Secondary | ICD-10-CM | POA: Diagnosis not present

## 2017-01-23 DIAGNOSIS — M545 Low back pain: Secondary | ICD-10-CM | POA: Diagnosis not present

## 2017-01-23 DIAGNOSIS — M9903 Segmental and somatic dysfunction of lumbar region: Secondary | ICD-10-CM | POA: Diagnosis not present

## 2017-01-26 ENCOUNTER — Telehealth: Payer: Self-pay | Admitting: Obstetrics and Gynecology

## 2017-01-26 NOTE — Telephone Encounter (Signed)
Called and left a message for patient to call back to schedule a new patient doctor referral. °

## 2017-02-07 ENCOUNTER — Ambulatory Visit: Payer: BLUE CROSS/BLUE SHIELD

## 2017-02-08 DIAGNOSIS — M545 Low back pain: Secondary | ICD-10-CM | POA: Diagnosis not present

## 2017-02-08 DIAGNOSIS — M9905 Segmental and somatic dysfunction of pelvic region: Secondary | ICD-10-CM | POA: Diagnosis not present

## 2017-02-08 DIAGNOSIS — M9902 Segmental and somatic dysfunction of thoracic region: Secondary | ICD-10-CM | POA: Diagnosis not present

## 2017-02-08 DIAGNOSIS — M9903 Segmental and somatic dysfunction of lumbar region: Secondary | ICD-10-CM | POA: Diagnosis not present

## 2017-02-20 DIAGNOSIS — M9903 Segmental and somatic dysfunction of lumbar region: Secondary | ICD-10-CM | POA: Diagnosis not present

## 2017-02-20 DIAGNOSIS — M9902 Segmental and somatic dysfunction of thoracic region: Secondary | ICD-10-CM | POA: Diagnosis not present

## 2017-02-20 DIAGNOSIS — M545 Low back pain: Secondary | ICD-10-CM | POA: Diagnosis not present

## 2017-02-20 DIAGNOSIS — M9905 Segmental and somatic dysfunction of pelvic region: Secondary | ICD-10-CM | POA: Diagnosis not present

## 2017-02-28 ENCOUNTER — Other Ambulatory Visit (HOSPITAL_COMMUNITY)
Admission: RE | Admit: 2017-02-28 | Discharge: 2017-02-28 | Disposition: A | Discharge status: Home / Self Care | Payer: BLUE CROSS/BLUE SHIELD | Source: Ambulatory Visit | Attending: Obstetrics and Gynecology | Admitting: Obstetrics and Gynecology

## 2017-02-28 ENCOUNTER — Ambulatory Visit: Payer: BLUE CROSS/BLUE SHIELD | Admitting: Obstetrics and Gynecology

## 2017-02-28 ENCOUNTER — Encounter: Payer: Self-pay | Admitting: Obstetrics and Gynecology

## 2017-02-28 VITALS — BP 122/64 | HR 76 | Resp 16 | Ht 67.0 in | Wt 170.0 lb

## 2017-02-28 DIAGNOSIS — M8580 Other specified disorders of bone density and structure, unspecified site: Secondary | ICD-10-CM

## 2017-02-28 DIAGNOSIS — Z124 Encounter for screening for malignant neoplasm of cervix: Secondary | ICD-10-CM

## 2017-02-28 DIAGNOSIS — Z01419 Encounter for gynecological examination (general) (routine) without abnormal findings: Secondary | ICD-10-CM | POA: Diagnosis not present

## 2017-02-28 DIAGNOSIS — E559 Vitamin D deficiency, unspecified: Secondary | ICD-10-CM | POA: Insufficient documentation

## 2017-02-28 DIAGNOSIS — Z85048 Personal history of other malignant neoplasm of rectum, rectosigmoid junction, and anus: Secondary | ICD-10-CM | POA: Diagnosis not present

## 2017-02-28 NOTE — Progress Notes (Signed)
61 y.o. [id number] MarriedCaucasianF here for annual exam.   The patient has a h/o rectal cancer with lung mets, in remission x 18 years. She had a colon resection, radiation and chemotherapy.  She had a colonoscopy last year, has lots of scarring from the radiation. She was told she needs f/u testing in another 2 years, may need to do something other than colonoscopy. Her bowels haven't been normal since treatment. She can have diarrhea and some fecal incontinence. BM 3-8 x a day. Needs to wear a pad for the incontinence, skin is in good shape. No urinary issues. Not sexually active for a long time (too stressful with the incontinence).  No vaginal c/o. In the past she used estrace cream, no longer needs it.   She had a colon perforation in 2012 with a colonoscopy. Needed another colon surgery.  States rectal cancer wasn't from HPV. She has never had an abnormal pap. Dad had rectal cancer 8 years after her.     No LMP recorded. Patient is postmenopausal.    Went through menopause with radiation and chemo.       Sexually active: No.  The current method of family planning is post menopausal status.    Exercising: Yes.    walking Smoker:  Former light smoker in college   Health Maintenance: Pap:  07/2015 WNL per patient  History of abnormal Pap:  no MMG:  03-10-2016 @ [facility] WNL Colonoscopy:  Spring 2017 polyps - repeat in 3 years  BMD:   09-06-11 Osteopenia @ [facility] TDaP:  unsure Gardasil: N/A   reports that she has quit smoking. She has a 0.20 pack-year smoking history. She has never used smokeless tobacco. She reports that she drinks about 2.4 - 3.6 oz of alcohol per week . She reports that she does not use drugs. 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 Married x 25 years. She has a 71 year old son. He is at [employer], starting his senior year. Communication studies.   Past Medical History:  Diagnosis Date  . Allergy   . Asthma   . Cancer [facility])    Rectal cancer with lung met - in  remission x 18 years.  . Osteopenia   . Perforated sigmoid colon ([place])   Asthma diagnosed in the last year. 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 Pulmonologist feels her voice changes is from her chronic coughing.   Past Surgical History:  Procedure Laterality Date  . APPENDECTOMY    . COLON RESECTION    . COLON SURGERY    . DILATION AND CURETTAGE OF UTERUS    . LUNG REMOVAL, PARTIAL      Current Outpatient Prescriptions  Medication Sig Dispense Refill  . fluticasone (FLONASE) 50 MCG/ACT nasal spray Place 2 sprays into both nostrils daily.    . Fluticasone-Salmeterol (ADVAIR) 250-50 MCG/DOSE AEPB Inhale 1 puff into the lungs 2 (two) times daily.    Marland Kitchen levocetirizine (XYZAL) 2.5 MG/5ML solution Take 2.5 mg by mouth every evening.    . montelukast (SINGULAIR) 10 MG tablet Take 10 mg by mouth at bedtime.    . Olopatadine HCl 0.6 % SOLN Place into the nose.     No current facility-administered medications for this visit.     Family History  Problem Relation Age of Onset  . COPD Mother   . Osteoporosis Mother   . Rectal cancer Father   . Breast cancer Maternal Grandmother  Review of Systems  Constitutional: Negative.   HENT: Negative.   Eyes: Negative.   Respiratory: Negative.   Cardiovascular: Negative.   Gastrointestinal: Negative.   Endocrine: Negative.   Genitourinary: Negative.   Musculoskeletal: Negative.   Skin: Negative.   Allergic/Immunologic: Negative.   Neurological: Negative.   Psychiatric/Behavioral: Negative.     Exam:   BP 122/64 (BP Location: Right Arm, Patient Position: Sitting, Cuff Size: Normal)   Pulse 76   Resp 16   Ht _0  (1.702 m)   Wt 170 lb (77.1 kg)   BMI 26.63 kg/m   Weight change: _1 @ Height:   Height: _2  (170.2 cm)  Ht Readings from Last 3 Encounters:  02/28/17 _3  (1.702 m)  01/15/17 _4  (1.727 m)    General appearance: alert, cooperative and appears stated age Head:  Normocephalic, without obvious abnormality, atraumatic Neck: no adenopathy, supple, symmetrical, trachea midline and thyroid normal to inspection and palpation Lungs: clear to auscultation bilaterally Cardiovascular: regular rate and rhythm Breasts: normal appearance, no masses or tenderness Abdomen: soft, non-tender; bowel sounds normal; no masses,  no organomegaly Extremities: extremities normal, atraumatic, no cyanosis or edema Skin: Skin color, texture, turgor normal. No rashes or lesions Lymph nodes: Cervical, supraclavicular, and axillary nodes normal. No abnormal inguinal nodes palpated Neurologic: Grossly normal   Pelvic: External genitalia:  no lesions              Urethra:  normal appearing urethra with no masses, tenderness or lesions              Bartholins and Skenes: normal                 Vagina: normal appearing vagina with normal color and discharge, no lesions              Cervix: no lesions               Bimanual Exam:  Uterus:  normal size, contour, position, consistency, mobility, non-tender              Adnexa: no mass, fullness, tenderness               Rectovaginal: declined  Chaperone was present for exam.  A:  Well Woman with normal exam  H/O vit d def. In the past she was on vit d supplementation  P:   Pap with hpv  Mammogram  DEXA  Discussed breast self exam  Discussed calcium and vit D intake  Vit d level

## 2017-02-28 NOTE — Addendum Note (Signed)
Addended by: Dorothy Spark on: 02/28/2017 04:42 PM   Modules accepted: Orders

## 2017-02-28 NOTE — Patient Instructions (Signed)
I would recommend you get 1,200 mg a day of calcium   EXERCISE AND DIET:  We recommended that you start or continue a regular exercise program for good health. Regular exercise means any activity that makes your heart beat faster and makes you sweat.  We recommend exercising at least 30 minutes per day at least 3 days a week, preferably 4 or 5.  We also recommend a diet low in fat and sugar.  Inactivity, poor dietary choices and obesity can cause diabetes, heart attack, stroke, and kidney damage, among others.    ALCOHOL AND SMOKING:  Women should limit their alcohol intake to no more than 7 drinks/beers/glasses of wine (combined, not each!) per week. Moderation of alcohol intake to this level decreases your risk of breast cancer and liver damage. And of course, no recreational drugs are part of a healthy lifestyle.  And absolutely no smoking or even second hand smoke. Most people know smoking can cause heart and lung diseases, but did you know it also contributes to weakening of your bones? Aging of your skin?  Yellowing of your teeth and nails?  CALCIUM AND VITAMIN D:  Adequate intake of calcium and Vitamin D are recommended.  The recommendations for exact amounts of these supplements seem to change often, but generally speaking 600 mg of calcium (either carbonate or citrate) and 800 units of Vitamin D per day seems prudent. Certain women may benefit from higher intake of Vitamin D.  If you are among these women, your doctor will have told you during your visit.    PAP SMEARS:  Pap smears, to check for cervical cancer or precancers,  have traditionally been done yearly, although recent scientific advances have shown that most women can have pap smears less often.  However, every woman still should have a physical exam from her gynecologist every year. It will include a breast check, inspection of the vulva and vagina to check for abnormal growths or skin changes, a visual exam of the cervix, and then an  exam to evaluate the size and shape of the uterus and ovaries.  And after 63 years of age, a rectal exam is indicated to check for rectal cancers. We will also provide age appropriate advice regarding health maintenance, like when you should have certain vaccines, screening for sexually transmitted diseases, bone density testing, colonoscopy, mammograms, etc.   MAMMOGRAMS:  All women over 39 years old should have a yearly mammogram. Many facilities now offer a "3D" mammogram, which may cost around $50 extra out of pocket. If possible,  we recommend you accept the option to have the 3D mammogram performed.  It both reduces the number of women who will be called back for extra views which then turn out to be normal, and it is better than the routine mammogram at detecting truly abnormal areas.    COLONOSCOPY:  Colonoscopy to screen for colon cancer is recommended for all women at age 79.  We know, you hate the idea of the prep.  We agree, BUT, having colon cancer and not knowing it is worse!!  Colon cancer so often starts as a polyp that can be seen and removed at colonscopy, which can quite literally save your life!  And if your first colonoscopy is normal and you have no family history of colon cancer, most women don't have to have it again for 10 years.  Once every ten years, you can do something that may end up saving your life, right?  We  will be happy to help you get it scheduled when you are ready.  Be sure to check your insurance coverage so you understand how much it will cost.  It may be covered as a preventative service at no cost, but you should check your particular policy.

## 2017-03-01 ENCOUNTER — Other Ambulatory Visit: Payer: Self-pay | Admitting: Obstetrics and Gynecology

## 2017-03-01 DIAGNOSIS — E559 Vitamin D deficiency, unspecified: Secondary | ICD-10-CM

## 2017-03-01 LAB — VITAMIN D 25 HYDROXY (VIT D DEFICIENCY, FRACTURES): Vit D, 25-Hydroxy: 15.4 ng/mL — ABNORMAL LOW (ref 30.0–100.0)

## 2017-03-02 LAB — CYTOLOGY - PAP
Diagnosis: NEGATIVE
HPV: NOT DETECTED

## 2017-03-06 ENCOUNTER — Telehealth: Payer: Self-pay | Admitting: Obstetrics and Gynecology

## 2017-03-06 DIAGNOSIS — M8580 Other specified disorders of bone density and structure, unspecified site: Secondary | ICD-10-CM

## 2017-03-06 NOTE — Telephone Encounter (Signed)
Left detailed message at number provided [phone] advising that order for BMD has been placed electronically to the [facility] and that she may contact them to schedule at her convenience. Advised to return call with any further questions.  Routing to provider for final review. Patient agreeable to disposition. Will close encounter.

## 2017-03-06 NOTE — Telephone Encounter (Signed)
Patient requesting order for DEXA scan be sent to [facility]

## 2017-03-08 DIAGNOSIS — M545 Low back pain: Secondary | ICD-10-CM | POA: Diagnosis not present

## 2017-03-08 DIAGNOSIS — M9903 Segmental and somatic dysfunction of lumbar region: Secondary | ICD-10-CM | POA: Diagnosis not present

## 2017-03-08 DIAGNOSIS — M9902 Segmental and somatic dysfunction of thoracic region: Secondary | ICD-10-CM | POA: Diagnosis not present

## 2017-03-08 DIAGNOSIS — M9905 Segmental and somatic dysfunction of pelvic region: Secondary | ICD-10-CM | POA: Diagnosis not present

## 2017-03-29 DIAGNOSIS — M9905 Segmental and somatic dysfunction of pelvic region: Secondary | ICD-10-CM | POA: Diagnosis not present

## 2017-03-29 DIAGNOSIS — M545 Low back pain: Secondary | ICD-10-CM | POA: Diagnosis not present

## 2017-03-29 DIAGNOSIS — M9902 Segmental and somatic dysfunction of thoracic region: Secondary | ICD-10-CM | POA: Diagnosis not present

## 2017-03-29 DIAGNOSIS — M9903 Segmental and somatic dysfunction of lumbar region: Secondary | ICD-10-CM | POA: Diagnosis not present

## 2017-04-02 ENCOUNTER — Ambulatory Visit
Admission: RE | Admit: 2017-04-02 | Discharge: 2017-04-02 | Disposition: A | Discharge status: Home / Self Care | Payer: BLUE CROSS/BLUE SHIELD | Source: Ambulatory Visit | Attending: Family | Admitting: Family

## 2017-04-02 ENCOUNTER — Ambulatory Visit
Admission: RE | Admit: 2017-04-02 | Discharge: 2017-04-02 | Disposition: A | Discharge status: Home / Self Care | Payer: BLUE CROSS/BLUE SHIELD | Source: Ambulatory Visit | Attending: Obstetrics and Gynecology | Admitting: Obstetrics and Gynecology

## 2017-04-02 DIAGNOSIS — Z1231 Encounter for screening mammogram for malignant neoplasm of breast: Secondary | ICD-10-CM

## 2017-04-02 DIAGNOSIS — Z780 Asymptomatic menopausal state: Secondary | ICD-10-CM | POA: Diagnosis not present

## 2017-04-02 DIAGNOSIS — M85852 Other specified disorders of bone density and structure, left thigh: Secondary | ICD-10-CM | POA: Diagnosis not present

## 2017-04-02 DIAGNOSIS — M8580 Other specified disorders of bone density and structure, unspecified site: Secondary | ICD-10-CM

## 2017-04-26 DIAGNOSIS — M9902 Segmental and somatic dysfunction of thoracic region: Secondary | ICD-10-CM | POA: Diagnosis not present

## 2017-04-26 DIAGNOSIS — M545 Low back pain: Secondary | ICD-10-CM | POA: Diagnosis not present

## 2017-04-26 DIAGNOSIS — M9903 Segmental and somatic dysfunction of lumbar region: Secondary | ICD-10-CM | POA: Diagnosis not present

## 2017-04-26 DIAGNOSIS — M9905 Segmental and somatic dysfunction of pelvic region: Secondary | ICD-10-CM | POA: Diagnosis not present

## 2017-05-10 DIAGNOSIS — M545 Low back pain: Secondary | ICD-10-CM | POA: Diagnosis not present

## 2017-05-10 DIAGNOSIS — M9902 Segmental and somatic dysfunction of thoracic region: Secondary | ICD-10-CM | POA: Diagnosis not present

## 2017-05-10 DIAGNOSIS — M9903 Segmental and somatic dysfunction of lumbar region: Secondary | ICD-10-CM | POA: Diagnosis not present

## 2017-05-10 DIAGNOSIS — M9905 Segmental and somatic dysfunction of pelvic region: Secondary | ICD-10-CM | POA: Diagnosis not present

## 2017-05-23 DIAGNOSIS — R0602 Shortness of breath: Secondary | ICD-10-CM | POA: Diagnosis not present

## 2017-05-23 DIAGNOSIS — R062 Wheezing: Secondary | ICD-10-CM | POA: Diagnosis not present

## 2017-05-23 DIAGNOSIS — R05 Cough: Secondary | ICD-10-CM | POA: Diagnosis not present

## 2017-05-23 DIAGNOSIS — R490 Dysphonia: Secondary | ICD-10-CM | POA: Diagnosis not present

## 2017-05-23 DIAGNOSIS — J45998 Other asthma: Secondary | ICD-10-CM | POA: Diagnosis not present

## 2017-05-24 DIAGNOSIS — M9903 Segmental and somatic dysfunction of lumbar region: Secondary | ICD-10-CM | POA: Diagnosis not present

## 2017-05-24 DIAGNOSIS — M9905 Segmental and somatic dysfunction of pelvic region: Secondary | ICD-10-CM | POA: Diagnosis not present

## 2017-05-24 DIAGNOSIS — M545 Low back pain: Secondary | ICD-10-CM | POA: Diagnosis not present

## 2017-05-24 DIAGNOSIS — M9902 Segmental and somatic dysfunction of thoracic region: Secondary | ICD-10-CM | POA: Diagnosis not present

## 2017-05-30 DIAGNOSIS — H5213 Myopia, bilateral: Secondary | ICD-10-CM | POA: Diagnosis not present

## 2017-06-07 DIAGNOSIS — M9903 Segmental and somatic dysfunction of lumbar region: Secondary | ICD-10-CM | POA: Diagnosis not present

## 2017-06-07 DIAGNOSIS — M9902 Segmental and somatic dysfunction of thoracic region: Secondary | ICD-10-CM | POA: Diagnosis not present

## 2017-06-07 DIAGNOSIS — M9905 Segmental and somatic dysfunction of pelvic region: Secondary | ICD-10-CM | POA: Diagnosis not present

## 2017-06-07 DIAGNOSIS — M545 Low back pain: Secondary | ICD-10-CM | POA: Diagnosis not present

## 2017-06-14 ENCOUNTER — Other Ambulatory Visit (INDEPENDENT_AMBULATORY_CARE_PROVIDER_SITE_OTHER): Payer: BLUE CROSS/BLUE SHIELD

## 2017-06-14 DIAGNOSIS — E559 Vitamin D deficiency, unspecified: Secondary | ICD-10-CM | POA: Diagnosis not present

## 2017-06-15 LAB — VITAMIN D 25 HYDROXY (VIT D DEFICIENCY, FRACTURES): Vit D, 25-Hydroxy: 36.6 ng/mL (ref 30.0–100.0)

## 2017-06-21 DIAGNOSIS — M9902 Segmental and somatic dysfunction of thoracic region: Secondary | ICD-10-CM | POA: Diagnosis not present

## 2017-06-21 DIAGNOSIS — M9905 Segmental and somatic dysfunction of pelvic region: Secondary | ICD-10-CM | POA: Diagnosis not present

## 2017-06-21 DIAGNOSIS — M9903 Segmental and somatic dysfunction of lumbar region: Secondary | ICD-10-CM | POA: Diagnosis not present

## 2017-06-21 DIAGNOSIS — M545 Low back pain: Secondary | ICD-10-CM | POA: Diagnosis not present

## 2017-07-03 DIAGNOSIS — M9902 Segmental and somatic dysfunction of thoracic region: Secondary | ICD-10-CM | POA: Diagnosis not present

## 2017-07-03 DIAGNOSIS — M9903 Segmental and somatic dysfunction of lumbar region: Secondary | ICD-10-CM | POA: Diagnosis not present

## 2017-07-03 DIAGNOSIS — M545 Low back pain: Secondary | ICD-10-CM | POA: Diagnosis not present

## 2017-07-03 DIAGNOSIS — M9905 Segmental and somatic dysfunction of pelvic region: Secondary | ICD-10-CM | POA: Diagnosis not present

## 2017-07-04 DIAGNOSIS — Z23 Encounter for immunization: Secondary | ICD-10-CM | POA: Diagnosis not present

## 2017-07-24 DIAGNOSIS — M545 Low back pain: Secondary | ICD-10-CM | POA: Diagnosis not present

## 2017-07-24 DIAGNOSIS — M9905 Segmental and somatic dysfunction of pelvic region: Secondary | ICD-10-CM | POA: Diagnosis not present

## 2017-07-24 DIAGNOSIS — M9902 Segmental and somatic dysfunction of thoracic region: Secondary | ICD-10-CM | POA: Diagnosis not present

## 2017-07-24 DIAGNOSIS — M9903 Segmental and somatic dysfunction of lumbar region: Secondary | ICD-10-CM | POA: Diagnosis not present

## 2017-08-28 DIAGNOSIS — M67472 Ganglion, left ankle and foot: Secondary | ICD-10-CM | POA: Diagnosis not present

## 2017-09-02 DIAGNOSIS — M67472 Ganglion, left ankle and foot: Secondary | ICD-10-CM | POA: Diagnosis not present

## 2017-09-13 DIAGNOSIS — M9903 Segmental and somatic dysfunction of lumbar region: Secondary | ICD-10-CM | POA: Diagnosis not present

## 2017-09-13 DIAGNOSIS — M9902 Segmental and somatic dysfunction of thoracic region: Secondary | ICD-10-CM | POA: Diagnosis not present

## 2017-09-13 DIAGNOSIS — M545 Low back pain: Secondary | ICD-10-CM | POA: Diagnosis not present

## 2017-09-13 DIAGNOSIS — M9905 Segmental and somatic dysfunction of pelvic region: Secondary | ICD-10-CM | POA: Diagnosis not present

## 2017-10-11 DIAGNOSIS — M545 Low back pain: Secondary | ICD-10-CM | POA: Diagnosis not present

## 2017-10-11 DIAGNOSIS — M9905 Segmental and somatic dysfunction of pelvic region: Secondary | ICD-10-CM | POA: Diagnosis not present

## 2017-10-11 DIAGNOSIS — M9903 Segmental and somatic dysfunction of lumbar region: Secondary | ICD-10-CM | POA: Diagnosis not present

## 2017-10-11 DIAGNOSIS — M9902 Segmental and somatic dysfunction of thoracic region: Secondary | ICD-10-CM | POA: Diagnosis not present

## 2017-11-06 DIAGNOSIS — M25572 Pain in left ankle and joints of left foot: Secondary | ICD-10-CM | POA: Diagnosis not present

## 2017-11-06 DIAGNOSIS — M67472 Ganglion, left ankle and foot: Secondary | ICD-10-CM | POA: Diagnosis not present

## 2017-11-15 DIAGNOSIS — M545 Low back pain: Secondary | ICD-10-CM | POA: Diagnosis not present

## 2017-11-15 DIAGNOSIS — M9905 Segmental and somatic dysfunction of pelvic region: Secondary | ICD-10-CM | POA: Diagnosis not present

## 2017-11-15 DIAGNOSIS — M9902 Segmental and somatic dysfunction of thoracic region: Secondary | ICD-10-CM | POA: Diagnosis not present

## 2017-11-15 DIAGNOSIS — M9903 Segmental and somatic dysfunction of lumbar region: Secondary | ICD-10-CM | POA: Diagnosis not present

## 2017-12-11 DIAGNOSIS — M25572 Pain in left ankle and joints of left foot: Secondary | ICD-10-CM | POA: Diagnosis not present

## 2017-12-11 DIAGNOSIS — M545 Low back pain: Secondary | ICD-10-CM | POA: Diagnosis not present

## 2017-12-11 DIAGNOSIS — M9902 Segmental and somatic dysfunction of thoracic region: Secondary | ICD-10-CM | POA: Diagnosis not present

## 2017-12-11 DIAGNOSIS — M9903 Segmental and somatic dysfunction of lumbar region: Secondary | ICD-10-CM | POA: Diagnosis not present

## 2017-12-11 DIAGNOSIS — M67472 Ganglion, left ankle and foot: Secondary | ICD-10-CM | POA: Diagnosis not present

## 2017-12-11 DIAGNOSIS — M9905 Segmental and somatic dysfunction of pelvic region: Secondary | ICD-10-CM | POA: Diagnosis not present

## 2017-12-19 DIAGNOSIS — M2040 Other hammer toe(s) (acquired), unspecified foot: Secondary | ICD-10-CM | POA: Diagnosis not present

## 2017-12-19 DIAGNOSIS — L720 Epidermal cyst: Secondary | ICD-10-CM | POA: Diagnosis not present

## 2017-12-19 DIAGNOSIS — M67472 Ganglion, left ankle and foot: Secondary | ICD-10-CM | POA: Diagnosis not present

## 2017-12-19 DIAGNOSIS — M25572 Pain in left ankle and joints of left foot: Secondary | ICD-10-CM | POA: Diagnosis not present

## 2017-12-27 DIAGNOSIS — M9903 Segmental and somatic dysfunction of lumbar region: Secondary | ICD-10-CM | POA: Diagnosis not present

## 2017-12-27 DIAGNOSIS — M9902 Segmental and somatic dysfunction of thoracic region: Secondary | ICD-10-CM | POA: Diagnosis not present

## 2017-12-27 DIAGNOSIS — M9905 Segmental and somatic dysfunction of pelvic region: Secondary | ICD-10-CM | POA: Diagnosis not present

## 2017-12-27 DIAGNOSIS — M545 Low back pain: Secondary | ICD-10-CM | POA: Diagnosis not present

## 2018-01-03 DIAGNOSIS — M9903 Segmental and somatic dysfunction of lumbar region: Secondary | ICD-10-CM | POA: Diagnosis not present

## 2018-01-03 DIAGNOSIS — M545 Low back pain: Secondary | ICD-10-CM | POA: Diagnosis not present

## 2018-01-03 DIAGNOSIS — M9905 Segmental and somatic dysfunction of pelvic region: Secondary | ICD-10-CM | POA: Diagnosis not present

## 2018-01-03 DIAGNOSIS — M9902 Segmental and somatic dysfunction of thoracic region: Secondary | ICD-10-CM | POA: Diagnosis not present

## 2018-02-17 DIAGNOSIS — J019 Acute sinusitis, unspecified: Secondary | ICD-10-CM | POA: Diagnosis not present

## 2018-02-17 DIAGNOSIS — J324 Chronic pansinusitis: Secondary | ICD-10-CM | POA: Diagnosis not present

## 2018-02-17 DIAGNOSIS — J309 Allergic rhinitis, unspecified: Secondary | ICD-10-CM | POA: Diagnosis not present

## 2018-06-20 DIAGNOSIS — H5213 Myopia, bilateral: Secondary | ICD-10-CM | POA: Diagnosis not present

## 2018-06-24 NOTE — Progress Notes (Signed)
62 y.o. [id number] Married White or Caucasian Not Hispanic or Latino female here for annual exam.  No vaginal bleeding.  The patient has a h/o rectal cancer with lung mets, in remission x 19 years. She had a colon resection, radiation and chemotherapy.  Long term issues with diarrhea and fecal incontinence.  Rare urinary incontinence.  Not sexually active.   Has been getting her mother settled in assisted living.     No LMP recorded. Patient is postmenopausal.          Sexually active: No.  The current method of family planning is post menopausal status.    Exercising: Yes.    walking Smoker:  no  Health Maintenance: Pap:  02/28/2017 normal with negative HPV History of abnormal Pap:  no MMG:  04/02/2017 [facility] 1 negative BMD:   04/02/2017 Osteopenia, FRAX 8.8/0.9%, T score -1.8 Colonoscopy: 2017 repeat in 3 years, difficult secondary to scar tissue, h/o colon perforation in 2012 (needed surgery).  TDaP:  Up to date per patient Gardasil: N/A   reports that she has never smoked. She has never used smokeless tobacco. She reports that she drinks about 4.0 - 6.0 standard drinks of alcohol per week. She reports that she does not use drugs. 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 Son's long term, serious girlfriend died in 04-01-2023 of a seizure.    Past Medical History:  Diagnosis Date  . Allergy   . Asthma   . Cancer [facility])    Rectal cancer with lung met - in remission x 18 years.  . Osteopenia   . Perforated sigmoid colon [facility])     Past Surgical History:  Procedure Laterality Date  . APPENDECTOMY    . COLON RESECTION    . COLON SURGERY    . DILATION AND CURETTAGE OF UTERUS    . LUNG REMOVAL, PARTIAL      Current Outpatient Medications  Medication Sig Dispense Refill  . levocetirizine (XYZAL) 2.5 MG/5ML solution Take 2.5 mg by mouth every evening.    . fluticasone (FLONASE) 50 MCG/ACT nasal spray Place 2 sprays into  both nostrils daily.    . Fluticasone-Salmeterol (ADVAIR) 250-50 MCG/DOSE AEPB Inhale 1 puff into the lungs 2 (two) times daily.    . montelukast (SINGULAIR) 10 MG tablet Take 10 mg by mouth at bedtime.    . Olopatadine HCl 0.6 % SOLN Place into the nose.     No current facility-administered medications for this visit.     Family History  Problem Relation Age of Onset  . COPD Mother   . Osteoporosis Mother   . Rectal cancer Father   . Breast cancer Maternal Grandmother     Review of Systems  Constitutional: Negative.   HENT: Negative.   Eyes: Negative.   Respiratory: Negative.   Cardiovascular: Negative.   Gastrointestinal: Negative.   Endocrine: Negative.   Genitourinary: Negative.   Musculoskeletal: Negative.   Skin: Negative.   Allergic/Immunologic: Negative.   Neurological: Negative.   Hematological: Negative.   Psychiatric/Behavioral: Negative.     Exam:   BP 128/86 (BP Location: Right Arm, Patient Position: Sitting, Cuff Size: Normal)   Pulse 64   Ht 5' 7"  (1.702 m)   Wt 176 lb 9.6 oz (80.1 kg)   BMI 27.66 kg/m   Weight change: @WEIGHTCHANGE @ Height:   Height: 5' 7"  (170.2 cm)  Ht Readings from Last 3 Encounters:  06/27/18 5' 7"  (1.702 m)  02/28/17 5' 7"  (1.702 m)  01/15/17 5' 8"  (1.727 m)    General appearance: alert, cooperative and appears stated age Head: Normocephalic, without obvious abnormality, atraumatic Neck: no adenopathy, supple, symmetrical, trachea midline and thyroid normal to inspection and palpation Lungs: clear to auscultation bilaterally Cardiovascular: regular rate and rhythm Breasts: normal appearance, no masses or tenderness Abdomen: soft, non-tender; non distended,  no masses,  no organomegaly Extremities: extremities normal, atraumatic, no cyanosis or edema Skin: Skin color, texture, turgor normal. No rashes or lesions Lymph nodes: Cervical, supraclavicular, and axillary nodes normal. No abnormal inguinal nodes  palpated Neurologic: Grossly normal   Pelvic: External genitalia:  no lesions              Urethra:  normal appearing urethra with no masses, tenderness or lesions              Bartholins and Skenes: normal                 Vagina: normal appearing vagina with normal color and discharge, no lesions              Cervix: no lesions               Bimanual Exam:  Uterus: not appreciably enlarged, no masses              Adnexa: no mass, fullness, tenderness               Rectovaginal: declines  Chaperone was present for exam.  A:  Well Woman with normal exam  Osteopenia  H/O rectal cancer  Fecal incontinence, long term, skin is healthy  P:   No pap this year  Mammogram due  Dexa next summer  Discussed breast self exam  Discussed calcium and vit D intake  Screening labs (not fasting)  She is f/u with GI for possible colonoscopy

## 2018-06-27 ENCOUNTER — Other Ambulatory Visit: Payer: Self-pay

## 2018-06-27 ENCOUNTER — Ambulatory Visit: Payer: BLUE CROSS/BLUE SHIELD | Admitting: Obstetrics and Gynecology

## 2018-06-27 ENCOUNTER — Encounter: Payer: Self-pay | Admitting: Obstetrics and Gynecology

## 2018-06-27 VITALS — BP 128/86 | HR 64 | Ht 67.0 in | Wt 176.6 lb

## 2018-06-27 DIAGNOSIS — Z0000 Encounter for general adult medical examination without abnormal findings: Secondary | ICD-10-CM | POA: Diagnosis not present

## 2018-06-27 DIAGNOSIS — M8580 Other specified disorders of bone density and structure, unspecified site: Secondary | ICD-10-CM

## 2018-06-27 DIAGNOSIS — E559 Vitamin D deficiency, unspecified: Secondary | ICD-10-CM

## 2018-06-27 DIAGNOSIS — Z01419 Encounter for gynecological examination (general) (routine) without abnormal findings: Secondary | ICD-10-CM | POA: Diagnosis not present

## 2018-06-27 DIAGNOSIS — Z85048 Personal history of other malignant neoplasm of rectum, rectosigmoid junction, and anus: Secondary | ICD-10-CM

## 2018-06-27 NOTE — Patient Instructions (Signed)
EXERCISE AND DIET:  We recommended that you start or continue a regular exercise program for good health. Regular exercise means any activity that makes your heart beat faster and makes you sweat.  We recommend exercising at least 30 minutes per day at least 3 days a week, preferably 4 or 5.  We also recommend a diet low in fat and sugar.  Inactivity, poor dietary choices and obesity can cause diabetes, heart attack, stroke, and kidney damage, among others.    ALCOHOL AND SMOKING:  Women should limit their alcohol intake to no more than 7 drinks/beers/glasses of wine (combined, not each!) per week. Moderation of alcohol intake to this level decreases your risk of breast cancer and liver damage. And of course, no recreational drugs are part of a healthy lifestyle.  And absolutely no smoking or even second hand smoke. Most people know smoking can cause heart and lung diseases, but did you know it also contributes to weakening of your bones? Aging of your skin?  Yellowing of your teeth and nails?  CALCIUM AND VITAMIN D:  Adequate intake of calcium and Vitamin D are recommended.  The recommendations for exact amounts of these supplements seem to change often, but generally speaking 600 mg of calcium (either carbonate or citrate) and 800 units of Vitamin D per day seems prudent. Certain women may benefit from higher intake of Vitamin D.  If you are among these women, your doctor will have told you during your visit.    PAP SMEARS:  Pap smears, to check for cervical cancer or precancers,  have traditionally been done yearly, although recent scientific advances have shown that most women can have pap smears less often.  However, every woman still should have a physical exam from her gynecologist every year. It will include a breast check, inspection of the vulva and vagina to check for abnormal growths or skin changes, a visual exam of the cervix, and then an exam to evaluate the size and shape of the uterus and  ovaries.  And after 64 years of age, a rectal exam is indicated to check for rectal cancers. We will also provide age appropriate advice regarding health maintenance, like when you should have certain vaccines, screening for sexually transmitted diseases, bone density testing, colonoscopy, mammograms, etc.   MAMMOGRAMS:  All women over 40 years old should have a yearly mammogram. Many facilities now offer a "3D" mammogram, which may cost around $50 extra out of pocket. If possible,  we recommend you accept the option to have the 3D mammogram performed.  It both reduces the number of women who will be called back for extra views which then turn out to be normal, and it is better than the routine mammogram at detecting truly abnormal areas.    COLONOSCOPY:  Colonoscopy to screen for colon cancer is recommended for all women at age 50.  We know, you hate the idea of the prep.  We agree, BUT, having colon cancer and not knowing it is worse!!  Colon cancer so often starts as a polyp that can be seen and removed at colonscopy, which can quite literally save your life!  And if your first colonoscopy is normal and you have no family history of colon cancer, most women don't have to have it again for 10 years.  Once every ten years, you can do something that may end up saving your life, right?  We will be happy to help you get it scheduled when you are ready.    Be sure to check your insurance coverage so you understand how much it will cost.  It may be covered as a preventative service at no cost, but you should check your particular policy.      Breast Self-Awareness Breast self-awareness means being familiar with how your breasts look and feel. It involves checking your breasts regularly and reporting any changes to your health care provider. Practicing breast self-awareness is important. A change in your breasts can be a sign of a serious medical problem. Being familiar with how your breasts look and feel allows  you to find any problems early, when treatment is more likely to be successful. All women should practice breast self-awareness, including women who have had breast implants. How to do a breast self-exam One way to learn what is normal for your breasts and whether your breasts are changing is to do a breast self-exam. To do a breast self-exam: Look for Changes  1. Remove all the clothing above your waist. 2. Stand in front of a mirror in a room with good lighting. 3. Put your hands on your hips. 4. Push your hands firmly downward. 5. Compare your breasts in the mirror. Look for differences between them (asymmetry), such as: ? Differences in shape. ? Differences in size. ? Puckers, dips, and bumps in one breast and not the other. 6. Look at each breast for changes in your skin, such as: ? Redness. ? Scaly areas. 7. Look for changes in your nipples, such as: ? Discharge. ? Bleeding. ? Dimpling. ? Redness. ? A change in position. Feel for Changes  Carefully feel your breasts for lumps and changes. It is best to do this while lying on your back on the floor and again while sitting or standing in the shower or tub with soapy water on your skin. Feel each breast in the following way:  Place the arm on the side of the breast you are examining above your head.  Feel your breast with the other hand.  Start in the nipple area and make  inch (2 cm) overlapping circles to feel your breast. Use the pads of your three middle fingers to do this. Apply light pressure, then medium pressure, then firm pressure. The light pressure will allow you to feel the tissue closest to the skin. The medium pressure will allow you to feel the tissue that is a little deeper. The firm pressure will allow you to feel the tissue close to the ribs.  Continue the overlapping circles, moving downward over the breast until you feel your ribs below your breast.  Move one finger-width toward the center of the body.  Continue to use the  inch (2 cm) overlapping circles to feel your breast as you move slowly up toward your collarbone.  Continue the up and down exam using all three pressures until you reach your armpit.  Write Down What You Find  Write down what is normal for each breast and any changes that you find. Keep a written record with breast changes or normal findings for each breast. By writing this information down, you do not need to depend only on memory for size, tenderness, or location. Write down where you are in your menstrual cycle, if you are still menstruating. If you are having trouble noticing differences in your breasts, do not get discouraged. With time you will become more familiar with the variations in your breasts and more comfortable with the exam. How often should I examine my breasts? Examine   your breasts every month. If you are breastfeeding, the best time to examine your breasts is after a feeding or after using a breast pump. If you menstruate, the best time to examine your breasts is 5-7 days after your period is over. During your period, your breasts are lumpier, and it may be more difficult to notice changes. When should I see my health care provider? See your health care provider if you notice:  A change in shape or size of your breasts or nipples.  A change in the skin of your breast or nipples, such as a reddened or scaly area.  Unusual discharge from your nipples.  A lump or thick area that was not there before.  Pain in your breasts.  Anything that concerns you.  This information is not intended to replace advice given to you by your health care provider. Make sure you discuss any questions you have with your health care provider. Document Released: 09/11/2005 Document Revised: 02/17/2016 Document Reviewed: 08/01/2015 Elsevier Interactive Patient Education  2018 [employer]  

## 2018-06-28 LAB — CBC
Hematocrit: 41.4 % (ref 34.0–46.6)
Hemoglobin: 13.7 g/dL (ref 11.1–15.9)
MCH: 31.8 pg (ref 26.6–33.0)
MCHC: 33.1 g/dL (ref 31.5–35.7)
MCV: 96 fL (ref 79–97)
Platelets: 188 10*3/uL (ref 150–450)
RBC: 4.31 x10E6/uL (ref 3.77–5.28)
RDW: 13.7 % (ref 12.3–15.4)
WBC: 4.5 10*3/uL (ref 3.4–10.8)

## 2018-06-28 LAB — COMPREHENSIVE METABOLIC PANEL
ALT: 25 IU/L (ref 0–32)
AST: 23 IU/L (ref 0–40)
Albumin/Globulin Ratio: 1.9 (ref 1.2–2.2)
Albumin: 4.3 g/dL (ref 3.6–4.8)
Alkaline Phosphatase: 68 IU/L (ref 39–117)
BUN/Creatinine Ratio: 19 (ref 12–28)
BUN: 16 mg/dL (ref 8–27)
Bilirubin Total: 0.3 mg/dL (ref 0.0–1.2)
CO2: 21 mmol/L (ref 20–29)
Calcium: 9.7 mg/dL (ref 8.7–10.3)
Chloride: 108 mmol/L — ABNORMAL HIGH (ref 96–106)
Creatinine, Ser: 0.86 mg/dL (ref 0.57–1.00)
GFR calc Af Amer: 83 mL/min/{1.73_m2} (ref >59)
GFR calc non Af Amer: 72 mL/min/{1.73_m2} (ref >59)
Globulin, Total: 2.3 g/dL (ref 1.5–4.5)
Glucose: 90 mg/dL (ref 65–99)
Potassium: 3.9 mmol/L (ref 3.5–5.2)
Sodium: 141 mmol/L (ref 134–144)
Total Protein: 6.6 g/dL (ref 6.0–8.5)

## 2018-06-28 LAB — LIPID PANEL
Chol/HDL Ratio: 2.9 ratio (ref 0.0–4.4)
Cholesterol, Total: 194 mg/dL (ref 100–199)
HDL: 68 mg/dL (ref >39)
LDL Calculated: 94 mg/dL (ref 0–99)
Triglycerides: 159 mg/dL — ABNORMAL HIGH (ref 0–149)
VLDL Cholesterol Cal: 32 mg/dL (ref 5–40)

## 2018-06-28 LAB — VITAMIN D 25 HYDROXY (VIT D DEFICIENCY, FRACTURES): Vit D, 25-Hydroxy: 26.8 ng/mL — ABNORMAL LOW (ref 30.0–100.0)

## 2018-07-24 DIAGNOSIS — Z23 Encounter for immunization: Secondary | ICD-10-CM | POA: Diagnosis not present

## 2018-08-06 DIAGNOSIS — K520 Gastroenteritis and colitis due to radiation: Secondary | ICD-10-CM | POA: Insufficient documentation

## 2018-08-06 DIAGNOSIS — C189 Malignant neoplasm of colon, unspecified: Secondary | ICD-10-CM | POA: Diagnosis not present

## 2018-08-06 DIAGNOSIS — Z85038 Personal history of other malignant neoplasm of large intestine: Secondary | ICD-10-CM | POA: Diagnosis not present

## 2018-08-18 DIAGNOSIS — H66001 Acute suppurative otitis media without spontaneous rupture of ear drum, right ear: Secondary | ICD-10-CM | POA: Diagnosis not present

## 2018-09-06 ENCOUNTER — Other Ambulatory Visit: Payer: Self-pay | Admitting: Obstetrics and Gynecology

## 2018-09-06 DIAGNOSIS — Z1231 Encounter for screening mammogram for malignant neoplasm of breast: Secondary | ICD-10-CM

## 2018-09-10 ENCOUNTER — Ambulatory Visit
Admission: RE | Admit: 2018-09-10 | Discharge: 2018-09-10 | Disposition: A | Discharge status: Home / Self Care | Payer: BLUE CROSS/BLUE SHIELD | Source: Ambulatory Visit

## 2018-09-10 DIAGNOSIS — Z1231 Encounter for screening mammogram for malignant neoplasm of breast: Secondary | ICD-10-CM

## 2018-09-11 ENCOUNTER — Other Ambulatory Visit: Payer: Self-pay | Admitting: Obstetrics and Gynecology

## 2018-09-11 DIAGNOSIS — R928 Other abnormal and inconclusive findings on diagnostic imaging of breast: Secondary | ICD-10-CM

## 2018-09-17 ENCOUNTER — Ambulatory Visit
Admission: RE | Admit: 2018-09-17 | Discharge: 2018-09-17 | Disposition: A | Discharge status: Home / Self Care | Payer: BLUE CROSS/BLUE SHIELD | Source: Ambulatory Visit | Attending: Obstetrics and Gynecology | Admitting: Obstetrics and Gynecology

## 2018-09-17 ENCOUNTER — Other Ambulatory Visit: Payer: Self-pay | Admitting: Obstetrics and Gynecology

## 2018-09-17 DIAGNOSIS — R928 Other abnormal and inconclusive findings on diagnostic imaging of breast: Secondary | ICD-10-CM

## 2018-09-17 DIAGNOSIS — R921 Mammographic calcification found on diagnostic imaging of breast: Secondary | ICD-10-CM | POA: Diagnosis not present

## 2018-10-02 ENCOUNTER — Encounter: Payer: BLUE CROSS/BLUE SHIELD | Admitting: Nurse Practitioner

## 2018-12-17 DIAGNOSIS — Z8669 Personal history of other diseases of the nervous system and sense organs: Secondary | ICD-10-CM | POA: Diagnosis not present

## 2018-12-17 DIAGNOSIS — H938X1 Other specified disorders of right ear: Secondary | ICD-10-CM | POA: Diagnosis not present

## 2018-12-17 DIAGNOSIS — J342 Deviated nasal septum: Secondary | ICD-10-CM | POA: Diagnosis not present

## 2018-12-17 DIAGNOSIS — H9201 Otalgia, right ear: Secondary | ICD-10-CM | POA: Diagnosis not present

## 2019-02-24 DIAGNOSIS — M9903 Segmental and somatic dysfunction of lumbar region: Secondary | ICD-10-CM | POA: Diagnosis not present

## 2019-02-24 DIAGNOSIS — M9902 Segmental and somatic dysfunction of thoracic region: Secondary | ICD-10-CM | POA: Diagnosis not present

## 2019-02-24 DIAGNOSIS — M545 Low back pain: Secondary | ICD-10-CM | POA: Diagnosis not present

## 2019-02-24 DIAGNOSIS — M9905 Segmental and somatic dysfunction of pelvic region: Secondary | ICD-10-CM | POA: Diagnosis not present

## 2019-02-28 DIAGNOSIS — M9903 Segmental and somatic dysfunction of lumbar region: Secondary | ICD-10-CM | POA: Diagnosis not present

## 2019-02-28 DIAGNOSIS — M9905 Segmental and somatic dysfunction of pelvic region: Secondary | ICD-10-CM | POA: Diagnosis not present

## 2019-02-28 DIAGNOSIS — M9902 Segmental and somatic dysfunction of thoracic region: Secondary | ICD-10-CM | POA: Diagnosis not present

## 2019-02-28 DIAGNOSIS — M545 Low back pain: Secondary | ICD-10-CM | POA: Diagnosis not present

## 2019-03-07 DIAGNOSIS — M9905 Segmental and somatic dysfunction of pelvic region: Secondary | ICD-10-CM | POA: Diagnosis not present

## 2019-03-07 DIAGNOSIS — M9903 Segmental and somatic dysfunction of lumbar region: Secondary | ICD-10-CM | POA: Diagnosis not present

## 2019-03-07 DIAGNOSIS — M9902 Segmental and somatic dysfunction of thoracic region: Secondary | ICD-10-CM | POA: Diagnosis not present

## 2019-03-07 DIAGNOSIS — M545 Low back pain: Secondary | ICD-10-CM | POA: Diagnosis not present

## 2019-03-12 DIAGNOSIS — M545 Low back pain: Secondary | ICD-10-CM | POA: Diagnosis not present

## 2019-03-12 DIAGNOSIS — M9902 Segmental and somatic dysfunction of thoracic region: Secondary | ICD-10-CM | POA: Diagnosis not present

## 2019-03-12 DIAGNOSIS — M9903 Segmental and somatic dysfunction of lumbar region: Secondary | ICD-10-CM | POA: Diagnosis not present

## 2019-03-12 DIAGNOSIS — M9905 Segmental and somatic dysfunction of pelvic region: Secondary | ICD-10-CM | POA: Diagnosis not present

## 2019-03-28 DIAGNOSIS — M545 Low back pain: Secondary | ICD-10-CM | POA: Diagnosis not present

## 2019-03-28 DIAGNOSIS — M9905 Segmental and somatic dysfunction of pelvic region: Secondary | ICD-10-CM | POA: Diagnosis not present

## 2019-03-28 DIAGNOSIS — M9902 Segmental and somatic dysfunction of thoracic region: Secondary | ICD-10-CM | POA: Diagnosis not present

## 2019-03-28 DIAGNOSIS — M9903 Segmental and somatic dysfunction of lumbar region: Secondary | ICD-10-CM | POA: Diagnosis not present

## 2019-04-01 ENCOUNTER — Telehealth: Payer: Self-pay | Admitting: Obstetrics and Gynecology

## 2019-04-01 NOTE — Telephone Encounter (Signed)
Call to patient. Patient states that she has noticed some vaginal bleeding here and there for several weeks. States she has a history of rectal cancer 20 years ago. Patient states that she has started to notice when she has a "difficult bowel movement" that it is causing something vaginally to bleed. Patient states she knows it is not coming from the rectum. States she has multiple bowel movements every day, but does not notice the bleeding every time. States she does not have to wear a liner or pad as the "staining" is only on the toilet paper. Patient states she has noticed this over time and it is now "more often than not" so it prompted her to call. Denies pain, fever/chills or nausea/vomiting. OV offered to patient. Patient scheduled for Thursday 04-03-2019 at 1430. Patient agreeable to date and time of appointment. Bleeding precautions reviewed with patient and she verbalized understanding.   Routing to provider and will close encounter.

## 2019-04-01 NOTE — Telephone Encounter (Signed)
Patient is calling regarding PMB. Patient stated that bleeding has been going on for several weeks, but has been intermittent. Patient stated that it is spotting and has not experienced any heavy bleeding.

## 2019-04-02 NOTE — Progress Notes (Signed)
GYNECOLOGY  VISIT   HPI: 63 y.o.   Married White or Caucasian Not Hispanic or Latino  female   [phone] with No LMP recorded. Patient is postmenopausal.   here for vaginal spotting intermittently for a few weeks. She notices it particularly is she strains with BM, sure it is coming from her vagina. Only notices it when she wipes. No pain, not sexually active.   She has a h/o rectal cancer with lung mets, in remission for 20 years. S/P colon resection, radiation and chemotherapy.     GYNECOLOGIC HISTORY: No LMP recorded. Patient is postmenopausal. Contraception:postmenopausal Menopausal hormone therapy: none        OB History    Gravida  3   Para  1   Term  1   Preterm      AB  2   Living  1     SAB  2   TAB      Ectopic      Multiple      Live Births  1              Patient Active Problem List   Diagnosis Date Noted  . History of rectal cancer 02/28/2017  . Cervical cancer screening 01/15/2017  . Routine adult health maintenance 01/15/2017    Past Medical History:  Diagnosis Date  . Allergy   . Asthma   . Cancer [facility])    Rectal cancer with lung met - in remission x 18 years.  . Osteopenia   . Perforated sigmoid colon [facility])     Past Surgical History:  Procedure Laterality Date  . APPENDECTOMY    . COLON RESECTION    . COLON SURGERY    . DILATION AND CURETTAGE OF UTERUS    . LUNG REMOVAL, PARTIAL      Current Outpatient Medications  Medication Sig Dispense Refill  . fluticasone (FLONASE) 50 MCG/ACT nasal spray Place 2 sprays into both nostrils daily.    Marland Kitchen levocetirizine (XYZAL) 2.5 MG/5ML solution Take 2.5 mg by mouth every evening.     No current facility-administered medications for this visit.      ALLERGIES: Patient has no known allergies.  Family History  Problem Relation Age of Onset  . COPD Mother   . Osteoporosis Mother   . Rectal cancer Father   . Breast cancer Maternal Grandmother     Social History   Socioeconomic  History  . Marital status: Married    Spouse name: Not on file  . Number of children: 1  . Years of education: 37  . Highest education level: Not on file  Occupational History  . Occupation: Theatre stage manager  Social Needs  . Financial resource strain: Not on file  . Food insecurity    Worry: Not on file    Inability: Not on file  . Transportation needs    Medical: Not on file    Non-medical: Not on file  Tobacco Use  . Smoking status: Never Smoker  . Smokeless tobacco: Never Used  Substance and Sexual Activity  . Alcohol use: Yes    Alcohol/week: 7.0 standard drinks    Types: 7 Glasses of wine per week  . Drug use: No  . Sexual activity: Not Currently    Birth control/protection: None, Post-menopausal  Lifestyle  . Physical activity    Days per week: Not on file    Minutes per session: Not on file  . Stress: Not on file  Relationships  . Social  connections    Talks on phone: Not on file    Gets together: Not on file    Attends religious service: Not on file    Active member of club or organization: Not on file    Attends meetings of clubs or organizations: Not on file    Relationship status: Not on file  . Intimate partner violence    Fear of current or ex partner: Not on file    Emotionally abused: Not on file    Physically abused: Not on file    Forced sexual activity: Not on file  Other Topics Concern  . Not on file  Social History Narrative   Fun/Hobby: Travel, read, walks for exercise   Denies abuse and feels safe at home     Review of Systems  Constitutional: Negative.   HENT: Negative.   Eyes: Negative.   Respiratory: Negative.   Cardiovascular: Negative.   Gastrointestinal: Negative.   Genitourinary:       PMB  Musculoskeletal: Negative.   Skin: Negative.   Neurological: Negative.   Endo/Heme/Allergies: Negative.   Psychiatric/Behavioral: Negative.     PHYSICAL EXAMINATION:    BP 118/82 (BP Location: Right Arm, Patient Position:  Sitting, Cuff Size: Normal)   Pulse 64   Temp 98 F (36.7 C) (Skin)   Wt 176 lb (79.8 kg)   BMI 27.57 kg/m     General appearance: alert, cooperative and appears stated age Abdomen: soft, non-tender; non distended, no masses,  no organomegaly  Pelvic: External genitalia:  no lesions              Urethra:  normal appearing urethra with no masses, tenderness or lesions              Bartholins and Skenes: normal                 Vagina: normal appearing vagina with normal color and discharge, no lesions              Cervix: no cervical motion tenderness and no lesions.               Bimanual Exam:  Uterus:  no masses or tenderness              Adnexa: no mass, fullness, tenderness                The risks of endometrial biopsy were reviewed and a consent was obtained.  A speculum was placed in the vagina and the cervix was cleansed with betadine. A tenaculum was placed on the cervix and the mini-pipelle was placed into the endometrial cavity. Minimal movement of the cervix when placing traction on the cervix. The uterus sounded to 6 cm. The endometrial biopsy was performed, barely any tissue was obtained, second pass was made, same result. The tenaculum and speculum were removed. There were no complications.    Chaperone was present for exam.  ASSESSMENT Postmenopausal bleeding H/O rectal cancer and radiation treatment.  Minimal movement of the cervix, suspect scarring from prior radiation.  PLAN Pap with hpv Endometrial biopsy done, scant sample She will need an ultrasound, possible sonohysterogram   An After Visit Summary was printed and given to the patient.

## 2019-04-03 ENCOUNTER — Other Ambulatory Visit: Payer: Self-pay

## 2019-04-03 ENCOUNTER — Other Ambulatory Visit (HOSPITAL_COMMUNITY)
Admission: RE | Admit: 2019-04-03 | Discharge: 2019-04-03 | Disposition: A | Discharge status: Home / Self Care | Payer: Medicare HMO | Source: Ambulatory Visit | Attending: Obstetrics and Gynecology | Admitting: Obstetrics and Gynecology

## 2019-04-03 ENCOUNTER — Ambulatory Visit: Payer: Medicare HMO | Admitting: Obstetrics and Gynecology

## 2019-04-03 ENCOUNTER — Other Ambulatory Visit: Payer: Medicare HMO

## 2019-04-03 ENCOUNTER — Encounter: Payer: Self-pay | Admitting: Obstetrics and Gynecology

## 2019-04-03 VITALS — BP 118/82 | HR 64 | Temp 98.0°F | Wt 176.0 lb

## 2019-04-03 DIAGNOSIS — Z124 Encounter for screening for malignant neoplasm of cervix: Secondary | ICD-10-CM | POA: Diagnosis not present

## 2019-04-03 DIAGNOSIS — Z1151 Encounter for screening for human papillomavirus (HPV): Secondary | ICD-10-CM | POA: Diagnosis not present

## 2019-04-03 DIAGNOSIS — R69 Illness, unspecified: Secondary | ICD-10-CM | POA: Diagnosis not present

## 2019-04-03 DIAGNOSIS — N950 Postmenopausal bleeding: Secondary | ICD-10-CM | POA: Diagnosis not present

## 2019-04-03 NOTE — Patient Instructions (Signed)

## 2019-04-07 ENCOUNTER — Ambulatory Visit
Admission: RE | Admit: 2019-04-07 | Discharge: 2019-04-07 | Disposition: A | Discharge status: Home / Self Care | Payer: Medicare HMO | Source: Ambulatory Visit | Attending: Obstetrics and Gynecology | Admitting: Obstetrics and Gynecology

## 2019-04-07 ENCOUNTER — Other Ambulatory Visit: Payer: Self-pay

## 2019-04-07 ENCOUNTER — Other Ambulatory Visit: Payer: Self-pay | Admitting: Obstetrics and Gynecology

## 2019-04-07 DIAGNOSIS — R928 Other abnormal and inconclusive findings on diagnostic imaging of breast: Secondary | ICD-10-CM

## 2019-04-07 DIAGNOSIS — R921 Mammographic calcification found on diagnostic imaging of breast: Secondary | ICD-10-CM | POA: Diagnosis not present

## 2019-04-08 LAB — CYTOLOGY - PAP
Diagnosis: NEGATIVE
HPV: NOT DETECTED

## 2019-04-17 NOTE — Progress Notes (Signed)
GYNECOLOGY  VISIT   HPI: 63 y.o.   Married White or Caucasian Not Hispanic or Latino  female   [phone] with No LMP recorded. Patient is postmenopausal.   here for evaluation of PMP bleeding. Endometrial biopsy was scant but benign, pap was normal.    No further bleeding since early July.   She has a h/o rectal cancer with lung mets, in remission for 20 years. S/P colon resection, radiation and chemotherapy.  GYNECOLOGIC HISTORY: No LMP recorded. Patient is postmenopausal. Contraception:Postmenopausal Menopausal hormone therapy: None OB History    Gravida  3   Para  1   Term  1   Preterm      AB  2   Living  1     SAB  2   TAB      Ectopic      Multiple      Live Births  1              Patient Active Problem List   Diagnosis Date Noted  . History of rectal cancer 02/28/2017  . Cervical cancer screening 01/15/2017  . Routine adult health maintenance 01/15/2017    Past Medical History:  Diagnosis Date  . Allergy   . Asthma   . Cancer [facility])    Rectal cancer with lung met - in remission x 18 years.  . Osteopenia   . Perforated sigmoid colon [facility])     Past Surgical History:  Procedure Laterality Date  . APPENDECTOMY    . COLON RESECTION    . COLON SURGERY    . DILATION AND CURETTAGE OF UTERUS    . LUNG REMOVAL, PARTIAL      Current Outpatient Medications  Medication Sig Dispense Refill  . fluticasone (FLONASE) 50 MCG/ACT nasal spray Place 2 sprays into both nostrils daily.    Marland Kitchen levocetirizine (XYZAL) 2.5 MG/5ML solution Take 2.5 mg by mouth every evening.     No current facility-administered medications for this visit.      ALLERGIES: Patient has no known allergies.  Family History  Problem Relation Age of Onset  . COPD Mother   . Osteoporosis Mother   . Rectal cancer Father   . Breast cancer Maternal Grandmother     Social History   Socioeconomic History  . Marital status: Married    Spouse name: Not on file  . Number of  children: 1  . Years of education: 31  . Highest education level: Not on file  Occupational History  . Occupation: Theatre stage manager  Social Needs  . Financial resource strain: Not on file  . Food insecurity    Worry: Not on file    Inability: Not on file  . Transportation needs    Medical: Not on file    Non-medical: Not on file  Tobacco Use  . Smoking status: Never Smoker  . Smokeless tobacco: Never Used  Substance and Sexual Activity  . Alcohol use: Yes    Alcohol/week: 7.0 standard drinks    Types: 7 Glasses of wine per week  . Drug use: No  . Sexual activity: Not Currently    Birth control/protection: None, Post-menopausal  Lifestyle  . Physical activity    Days per week: Not on file    Minutes per session: Not on file  . Stress: Not on file  Relationships  . Social Herbalist on phone: Not on file    Gets together: Not on file    Attends  religious service: Not on file    Active member of club or organization: Not on file    Attends meetings of clubs or organizations: Not on file    Relationship status: Not on file  . Intimate partner violence    Fear of current or ex partner: Not on file    Emotionally abused: Not on file    Physically abused: Not on file    Forced sexual activity: Not on file  Other Topics Concern  . Not on file  Social History Narrative   Fun/Hobby: Travel, read, walks for exercise   Denies abuse and feels safe at home     Review of Systems  Constitutional: Negative.   HENT: Negative.   Eyes: Negative.   Respiratory: Negative.   Cardiovascular: Negative.   Gastrointestinal: Negative.   Genitourinary: Negative.   Musculoskeletal: Negative.   Skin: Negative.   Neurological: Negative.   Endo/Heme/Allergies: Negative.   Psychiatric/Behavioral: Negative.     PHYSICAL EXAMINATION:    BP 112/80 (BP Location: Right Arm, Patient Position: Sitting, Cuff Size: Normal)   Pulse 64   Temp 98 F (36.7 C) (Skin)   Wt 176 lb  (79.8 kg)   BMI 27.57 kg/m     General appearance: alert, cooperative and appears stated age  Ultrasound images reviewed with the patient  ASSESSMENT Postmenopausal bleeding, scant but atrophic biopsy, normal ultrasound with very thin endometrial stripe No further bleeding in the last 3.5 weeks    PLAN Patient reassured Call with further bleeding F/U for annual exam in 10/20   An After Visit Summary was printed and given to the patient.

## 2019-04-18 ENCOUNTER — Other Ambulatory Visit: Payer: Self-pay

## 2019-04-22 ENCOUNTER — Encounter: Payer: Self-pay | Admitting: Obstetrics and Gynecology

## 2019-04-22 ENCOUNTER — Other Ambulatory Visit: Payer: Self-pay | Admitting: Obstetrics and Gynecology

## 2019-04-22 ENCOUNTER — Ambulatory Visit: Payer: Medicare HMO | Admitting: Obstetrics and Gynecology

## 2019-04-22 ENCOUNTER — Ambulatory Visit (INDEPENDENT_AMBULATORY_CARE_PROVIDER_SITE_OTHER): Payer: Medicare HMO

## 2019-04-22 ENCOUNTER — Other Ambulatory Visit: Payer: Self-pay

## 2019-04-22 VITALS — BP 112/80 | HR 64 | Temp 98.0°F | Wt 176.0 lb

## 2019-04-22 DIAGNOSIS — N950 Postmenopausal bleeding: Secondary | ICD-10-CM

## 2019-04-28 DIAGNOSIS — M9905 Segmental and somatic dysfunction of pelvic region: Secondary | ICD-10-CM | POA: Diagnosis not present

## 2019-04-28 DIAGNOSIS — M9903 Segmental and somatic dysfunction of lumbar region: Secondary | ICD-10-CM | POA: Diagnosis not present

## 2019-04-28 DIAGNOSIS — D225 Melanocytic nevi of trunk: Secondary | ICD-10-CM | POA: Diagnosis not present

## 2019-04-28 DIAGNOSIS — M9902 Segmental and somatic dysfunction of thoracic region: Secondary | ICD-10-CM | POA: Diagnosis not present

## 2019-04-28 DIAGNOSIS — L821 Other seborrheic keratosis: Secondary | ICD-10-CM | POA: Diagnosis not present

## 2019-04-28 DIAGNOSIS — D1801 Hemangioma of skin and subcutaneous tissue: Secondary | ICD-10-CM | POA: Diagnosis not present

## 2019-04-28 DIAGNOSIS — M545 Low back pain: Secondary | ICD-10-CM | POA: Diagnosis not present

## 2019-04-28 DIAGNOSIS — L814 Other melanin hyperpigmentation: Secondary | ICD-10-CM | POA: Diagnosis not present

## 2019-04-29 ENCOUNTER — Other Ambulatory Visit: Payer: BLUE CROSS/BLUE SHIELD

## 2019-06-26 ENCOUNTER — Ambulatory Visit
Admission: RE | Admit: 2019-06-26 | Discharge: 2019-06-26 | Disposition: A | Discharge status: Home / Self Care | Payer: Medicare HMO | Source: Ambulatory Visit | Attending: Obstetrics and Gynecology | Admitting: Obstetrics and Gynecology

## 2019-06-26 ENCOUNTER — Other Ambulatory Visit: Payer: Self-pay

## 2019-06-26 DIAGNOSIS — M8589 Other specified disorders of bone density and structure, multiple sites: Secondary | ICD-10-CM | POA: Diagnosis not present

## 2019-06-26 DIAGNOSIS — M8580 Other specified disorders of bone density and structure, unspecified site: Secondary | ICD-10-CM

## 2019-06-26 DIAGNOSIS — R69 Illness, unspecified: Secondary | ICD-10-CM | POA: Diagnosis not present

## 2019-06-26 DIAGNOSIS — Z780 Asymptomatic menopausal state: Secondary | ICD-10-CM | POA: Diagnosis not present

## 2019-07-10 NOTE — Progress Notes (Signed)
63 y.o. G3P1021 Married White or Caucasian Not Hispanic or Latino female here for annual exam.   The patient was seen this last summer with PMP bleeding. Pap was normal, biopsy was scant but benign and ultrasound showed a thin uniform endometrial stripe. No further bleeding.   She has a h/o rectal cancer with lung mets in her mid 40's, in remission. S/P colon resection, radiation and chemotherapy. Long term issues with diarrhea and fecal incontinence. H/O colon perforation during colonoscopy in 2012, needed surgery.     No LMP recorded. Patient is postmenopausal.          Sexually active: No.  The current method of family planning is post menopausal status.    Exercising: Yes.    walking Smoker:  no  Health Maintenance: Pap:  04/03/2019 WNL NEG HPV, 02/28/2017 normal with negative HPV History of abnormal Pap:  no MMG:  09/10/2018 Birads 3 probably benign, 04/07/2019 Birads 3 probably benign BMD:   06/26/2019 Osteopenia, T score -1.6, FRAX 9.3/1.1% Colonoscopy: 2017 repeat in 3 years, difficult secondary to scar tissue, h/o colon perforation in 2012 (needed surgery).  TDaP:  Up to date per patient Gardasil: N/A   reports that she has never smoked. She has never used smokeless tobacco. She reports current alcohol use of about 7.0 standard drinks of alcohol per week. She reports that she does not use drugs. 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 She is considering cutting back. Son graduated from [employer] this year, currently with covid he is home.   Past Medical History:  Diagnosis Date  . Allergy   . Asthma   . Cancer (HCC)    Rectal cancer with lung met - in remission x 18 years.  . Osteopenia   . Perforated sigmoid colon (HCC)     Past Surgical History:  Procedure Laterality Date  . APPENDECTOMY    . COLON RESECTION    . COLON SURGERY    . DILATION AND CURETTAGE OF UTERUS    . LUNG REMOVAL, PARTIAL      Current Outpatient Medications  Medication Sig Dispense Refill  .  levocetirizine (XYZAL) 2.5 MG/5ML solution Take 2.5 mg by mouth every evening.     No current facility-administered medications for this visit.     Family History  Problem Relation Age of Onset  . COPD Mother   . Osteoporosis Mother   . Rectal cancer Father   . Breast cancer Maternal Grandmother     Review of Systems  Constitutional: Negative.   HENT: Negative.   Eyes: Negative.   Respiratory: Negative.   Cardiovascular: Negative.   Gastrointestinal: Negative.   Endocrine: Negative.   Genitourinary: Negative.   Musculoskeletal: Negative.   Skin: Negative.   Allergic/Immunologic: Negative.   Neurological: Negative.   Hematological: Negative.   Psychiatric/Behavioral: Negative.     Exam:   BP 120/82 (BP Location: Right Arm, Patient Position: Sitting, Cuff Size: Normal)   Pulse 60   Temp (!) 97.1 F (36.2 C) (Skin)   Ht 5' 6.54" (1.69 m)   Wt 171 lb 6.4 oz (77.7 kg)   BMI 27.22 kg/m   Weight change: @WEIGHTCHANGE@ Height:   Height: 5' 6.54" (169 cm)  Ht Readings from Last 3 Encounters:  07/14/19 5' 6.54" (1.69 m)  06/27/18 5' 7" (1.702 m)  02/28/17 5' 7" (1.702 m)    General appearance: alert, cooperative and appears stated age Head: Normocephalic, without obvious abnormality, atraumatic Neck: no adenopathy, supple, symmetrical, trachea midline   and thyroid normal to inspection and palpation Lungs: clear to auscultation bilaterally Cardiovascular: regular rate and rhythm Breasts: normal appearance, no masses or tenderness Abdomen: soft, non-tender; non distended,  no masses,  no organomegaly Extremities: extremities normal, atraumatic, no cyanosis or edema Skin: Skin color, texture, turgor normal. No rashes or lesions Lymph nodes: Cervical, supraclavicular, and axillary nodes normal. No abnormal inguinal nodes palpated Neurologic: Grossly normal   Pelvic: External genitalia:  no lesions              Urethra:  normal appearing urethra with no masses, tenderness  or lesions              Bartholins and Skenes: normal                 Vagina: atrophic appearing vagina with normal color and discharge, no lesions              Cervix: no lesions               Bimanual Exam:  Uterus:  normal size, contour, position, consistency, mobility, non-tender              Adnexa: no mass, fullness, tenderness               Rectovaginal: Confirms               Anus:  normal sphincter tone, no lesions  Chaperone was present for exam.  A:  Well Woman with normal exam  H/O rectal cancer 20 years ago  Osteopenia  P:   No pap needed today  Mammogram scheduled  Seeing a Dermatologist for routine screening  Return for fasting lab visit  DEXA UTD  CT colon cancer screening planned instead of colonoscopy  Discussed breast self exam  Discussed calcium and vit D intake

## 2019-07-14 ENCOUNTER — Ambulatory Visit (INDEPENDENT_AMBULATORY_CARE_PROVIDER_SITE_OTHER): Payer: Medicare HMO | Admitting: Obstetrics and Gynecology

## 2019-07-14 ENCOUNTER — Encounter: Payer: Self-pay | Admitting: Obstetrics and Gynecology

## 2019-07-14 ENCOUNTER — Other Ambulatory Visit: Payer: Self-pay

## 2019-07-14 VITALS — BP 120/82 | HR 60 | Temp 97.1°F | Ht 66.54 in | Wt 171.4 lb

## 2019-07-14 DIAGNOSIS — Z85048 Personal history of other malignant neoplasm of rectum, rectosigmoid junction, and anus: Secondary | ICD-10-CM | POA: Diagnosis not present

## 2019-07-14 DIAGNOSIS — Z0000 Encounter for general adult medical examination without abnormal findings: Secondary | ICD-10-CM | POA: Diagnosis not present

## 2019-07-14 DIAGNOSIS — E559 Vitamin D deficiency, unspecified: Secondary | ICD-10-CM

## 2019-07-14 DIAGNOSIS — Z01419 Encounter for gynecological examination (general) (routine) without abnormal findings: Secondary | ICD-10-CM | POA: Diagnosis not present

## 2019-07-14 DIAGNOSIS — M8580 Other specified disorders of bone density and structure, unspecified site: Secondary | ICD-10-CM | POA: Diagnosis not present

## 2019-07-14 NOTE — Patient Instructions (Signed)
EXERCISE AND DIET:  We recommended that you start or continue a regular exercise program for good health. Regular exercise means any activity that makes your heart beat faster and makes you sweat.  We recommend exercising at least 30 minutes per day at least 3 days a week, preferably 4 or 5.  We also recommend a diet low in fat and sugar.  Inactivity, poor dietary choices and obesity can cause diabetes, heart attack, stroke, and kidney damage, among others.    ALCOHOL AND SMOKING:  Women should limit their alcohol intake to no more than 7 drinks/beers/glasses of wine (combined, not each!) per week. Moderation of alcohol intake to this level decreases your risk of breast cancer and liver damage. And of course, no recreational drugs are part of a healthy lifestyle.  And absolutely no smoking or even second hand smoke. Most people know smoking can cause heart and lung diseases, but did you know it also contributes to weakening of your bones? Aging of your skin?  Yellowing of your teeth and nails?  CALCIUM AND VITAMIN D:  Adequate intake of calcium and Vitamin D are recommended.  The recommendations for exact amounts of these supplements seem to change often, but generally speaking 1,200 mg of calcium (between diet and supplement) and 800 units of Vitamin D per day seems prudent. Certain women may benefit from higher intake of Vitamin D.  If you are among these women, your doctor will have told you during your visit.    PAP SMEARS:  Pap smears, to check for cervical cancer or precancers,  have traditionally been done yearly, although recent scientific advances have shown that most women can have pap smears less often.  However, every woman still should have a physical exam from her gynecologist every year. It will include a breast check, inspection of the vulva and vagina to check for abnormal growths or skin changes, a visual exam of the cervix, and then an exam to evaluate the size and shape of the uterus and  ovaries.  And after 65 years of age, a rectal exam is indicated to check for rectal cancers. We will also provide age appropriate advice regarding health maintenance, like when you should have certain vaccines, screening for sexually transmitted diseases, bone density testing, colonoscopy, mammograms, etc.   MAMMOGRAMS:  All women over 40 years old should have a yearly mammogram. Many facilities now offer a "3D" mammogram, which may cost around $50 extra out of pocket. If possible,  we recommend you accept the option to have the 3D mammogram performed.  It both reduces the number of women who will be called back for extra views which then turn out to be normal, and it is better than the routine mammogram at detecting truly abnormal areas.    COLON CANCER SCREENING: Now recommend starting at age 45. At this time colonoscopy is not covered for routine screening until 50. There are take home tests that can be done between 45-49.   COLONOSCOPY:  Colonoscopy to screen for colon cancer is recommended for all women at age 50.  We know, you hate the idea of the prep.  We agree, BUT, having colon cancer and not knowing it is worse!!  Colon cancer so often starts as a polyp that can be seen and removed at colonscopy, which can quite literally save your life!  And if your first colonoscopy is normal and you have no family history of colon cancer, most women don't have to have it again for   10 years.  Once every ten years, you can do something that may end up saving your life, right?  We will be happy to help you get it scheduled when you are ready.  Be sure to check your insurance coverage so you understand how much it will cost.  It may be covered as a preventative service at no cost, but you should check your particular policy.      Breast Self-Awareness Breast self-awareness means being familiar with how your breasts look and feel. It involves checking your breasts regularly and reporting any changes to your  health care provider. Practicing breast self-awareness is important. A change in your breasts can be a sign of a serious medical problem. Being familiar with how your breasts look and feel allows you to find any problems early, when treatment is more likely to be successful. All women should practice breast self-awareness, including women who have had breast implants. How to do a breast self-exam One way to learn what is normal for your breasts and whether your breasts are changing is to do a breast self-exam. To do a breast self-exam: Look for Changes  1. Remove all the clothing above your waist. 2. Stand in front of a mirror in a room with good lighting. 3. Put your hands on your hips. 4. Push your hands firmly downward. 5. Compare your breasts in the mirror. Look for differences between them (asymmetry), such as: ? Differences in shape. ? Differences in size. ? Puckers, dips, and bumps in one breast and not the other. 6. Look at each breast for changes in your skin, such as: ? Redness. ? Scaly areas. 7. Look for changes in your nipples, such as: ? Discharge. ? Bleeding. ? Dimpling. ? Redness. ? A change in position. Feel for Changes Carefully feel your breasts for lumps and changes. It is best to do this while lying on your back on the floor and again while sitting or standing in the shower or tub with soapy water on your skin. Feel each breast in the following way:  Place the arm on the side of the breast you are examining above your head.  Feel your breast with the other hand.  Start in the nipple area and make  inch (2 cm) overlapping circles to feel your breast. Use the pads of your three middle fingers to do this. Apply light pressure, then medium pressure, then firm pressure. The light pressure will allow you to feel the tissue closest to the skin. The medium pressure will allow you to feel the tissue that is a little deeper. The firm pressure will allow you to feel the tissue  close to the ribs.  Continue the overlapping circles, moving downward over the breast until you feel your ribs below your breast.  Move one finger-width toward the center of the body. Continue to use the  inch (2 cm) overlapping circles to feel your breast as you move slowly up toward your collarbone.  Continue the up and down exam using all three pressures until you reach your armpit.  Write Down What You Find  Write down what is normal for each breast and any changes that you find. Keep a written record with breast changes or normal findings for each breast. By writing this information down, you do not need to depend only on memory for size, tenderness, or location. Write down where you are in your menstrual cycle, if you are still menstruating. If you are having trouble noticing differences   in your breasts, do not get discouraged. With time you will become more familiar with the variations in your breasts and more comfortable with the exam. How often should I examine my breasts? Examine your breasts every month. If you are breastfeeding, the best time to examine your breasts is after a feeding or after using a breast pump. If you menstruate, the best time to examine your breasts is 5-7 days after your period is over. During your period, your breasts are lumpier, and it may be more difficult to notice changes. When should I see my health care provider? See your health care provider if you notice:  A change in shape or size of your breasts or nipples.  A change in the skin of your breast or nipples, such as a reddened or scaly area.  Unusual discharge from your nipples.  A lump or thick area that was not there before.  Pain in your breasts.  Anything that concerns you.  

## 2019-07-17 ENCOUNTER — Other Ambulatory Visit: Payer: Self-pay

## 2019-07-17 ENCOUNTER — Other Ambulatory Visit (INDEPENDENT_AMBULATORY_CARE_PROVIDER_SITE_OTHER): Payer: Medicare HMO

## 2019-07-17 DIAGNOSIS — E789 Disorder of lipoprotein metabolism, unspecified: Secondary | ICD-10-CM | POA: Diagnosis not present

## 2019-07-17 DIAGNOSIS — E559 Vitamin D deficiency, unspecified: Secondary | ICD-10-CM | POA: Diagnosis not present

## 2019-07-17 DIAGNOSIS — Z0000 Encounter for general adult medical examination without abnormal findings: Secondary | ICD-10-CM | POA: Diagnosis not present

## 2019-07-17 DIAGNOSIS — R69 Illness, unspecified: Secondary | ICD-10-CM | POA: Diagnosis not present

## 2019-07-18 LAB — LIPID PANEL
Chol/HDL Ratio: 2.7 ratio (ref 0.0–4.4)
Cholesterol, Total: 168 mg/dL (ref 100–199)
HDL: 63 mg/dL (ref >39)
LDL Chol Calc (NIH): 88 mg/dL (ref 0–99)
Triglycerides: 90 mg/dL (ref 0–149)
VLDL Cholesterol Cal: 17 mg/dL (ref 5–40)

## 2019-07-18 LAB — COMPREHENSIVE METABOLIC PANEL
ALT: 24 IU/L (ref 0–32)
AST: 24 IU/L (ref 0–40)
Albumin/Globulin Ratio: 2.3 — ABNORMAL HIGH (ref 1.2–2.2)
Albumin: 4.3 g/dL (ref 3.8–4.8)
Alkaline Phosphatase: 65 IU/L (ref 39–117)
BUN/Creatinine Ratio: 18 (ref 12–28)
BUN: 15 mg/dL (ref 8–27)
Bilirubin Total: 0.5 mg/dL (ref 0.0–1.2)
CO2: 25 mmol/L (ref 20–29)
Calcium: 9.3 mg/dL (ref 8.7–10.3)
Chloride: 106 mmol/L (ref 96–106)
Creatinine, Ser: 0.83 mg/dL (ref 0.57–1.00)
GFR calc Af Amer: 86 mL/min/{1.73_m2} (ref >59)
GFR calc non Af Amer: 74 mL/min/{1.73_m2} (ref >59)
Globulin, Total: 1.9 g/dL (ref 1.5–4.5)
Glucose: 81 mg/dL (ref 65–99)
Potassium: 4.6 mmol/L (ref 3.5–5.2)
Sodium: 140 mmol/L (ref 134–144)
Total Protein: 6.2 g/dL (ref 6.0–8.5)

## 2019-07-18 LAB — CBC
Hematocrit: 43.1 % (ref 34.0–46.6)
Hemoglobin: 15 g/dL (ref 11.1–15.9)
MCH: 33.5 pg — ABNORMAL HIGH (ref 26.6–33.0)
MCHC: 34.8 g/dL (ref 31.5–35.7)
MCV: 96 fL (ref 79–97)
Platelets: 175 10*3/uL (ref 150–450)
RBC: 4.48 x10E6/uL (ref 3.77–5.28)
RDW: 12.5 % (ref 11.7–15.4)
WBC: 3.5 10*3/uL (ref 3.4–10.8)

## 2019-07-18 LAB — VITAMIN D 25 HYDROXY (VIT D DEFICIENCY, FRACTURES): Vit D, 25-Hydroxy: 29.2 ng/mL — ABNORMAL LOW (ref 30.0–100.0)

## 2019-07-19 DIAGNOSIS — K559 Vascular disorder of intestine, unspecified: Secondary | ICD-10-CM | POA: Diagnosis not present

## 2019-07-19 DIAGNOSIS — K922 Gastrointestinal hemorrhage, unspecified: Secondary | ICD-10-CM | POA: Diagnosis not present

## 2019-07-19 DIAGNOSIS — K625 Hemorrhage of anus and rectum: Secondary | ICD-10-CM | POA: Diagnosis not present

## 2019-07-19 DIAGNOSIS — R1030 Lower abdominal pain, unspecified: Secondary | ICD-10-CM | POA: Diagnosis not present

## 2019-07-19 DIAGNOSIS — Z923 Personal history of irradiation: Secondary | ICD-10-CM | POA: Diagnosis not present

## 2019-07-19 DIAGNOSIS — Z9221 Personal history of antineoplastic chemotherapy: Secondary | ICD-10-CM | POA: Diagnosis not present

## 2019-07-19 DIAGNOSIS — Z79899 Other long term (current) drug therapy: Secondary | ICD-10-CM | POA: Diagnosis not present

## 2019-07-19 DIAGNOSIS — Z85038 Personal history of other malignant neoplasm of large intestine: Secondary | ICD-10-CM | POA: Diagnosis not present

## 2019-07-19 DIAGNOSIS — K529 Noninfective gastroenteritis and colitis, unspecified: Secondary | ICD-10-CM | POA: Diagnosis not present

## 2019-07-20 DIAGNOSIS — K625 Hemorrhage of anus and rectum: Secondary | ICD-10-CM | POA: Diagnosis not present

## 2019-07-20 DIAGNOSIS — K559 Vascular disorder of intestine, unspecified: Secondary | ICD-10-CM | POA: Diagnosis not present

## 2019-07-21 DIAGNOSIS — K559 Vascular disorder of intestine, unspecified: Secondary | ICD-10-CM | POA: Diagnosis not present

## 2019-07-21 DIAGNOSIS — K625 Hemorrhage of anus and rectum: Secondary | ICD-10-CM | POA: Diagnosis not present

## 2019-07-22 DIAGNOSIS — K625 Hemorrhage of anus and rectum: Secondary | ICD-10-CM | POA: Diagnosis not present

## 2019-07-22 DIAGNOSIS — K559 Vascular disorder of intestine, unspecified: Secondary | ICD-10-CM | POA: Diagnosis not present

## 2019-08-12 DIAGNOSIS — Z85038 Personal history of other malignant neoplasm of large intestine: Secondary | ICD-10-CM | POA: Diagnosis not present

## 2019-08-12 DIAGNOSIS — K529 Noninfective gastroenteritis and colitis, unspecified: Secondary | ICD-10-CM | POA: Diagnosis not present

## 2019-09-15 ENCOUNTER — Other Ambulatory Visit: Payer: Self-pay

## 2019-09-15 ENCOUNTER — Ambulatory Visit
Admission: RE | Admit: 2019-09-15 | Discharge: 2019-09-15 | Disposition: A | Discharge status: Home / Self Care | Payer: Medicare HMO | Source: Ambulatory Visit | Attending: Obstetrics and Gynecology | Admitting: Obstetrics and Gynecology

## 2019-09-15 DIAGNOSIS — R928 Other abnormal and inconclusive findings on diagnostic imaging of breast: Secondary | ICD-10-CM

## 2019-09-15 DIAGNOSIS — R921 Mammographic calcification found on diagnostic imaging of breast: Secondary | ICD-10-CM | POA: Diagnosis not present

## 2019-10-20 DIAGNOSIS — Z9889 Other specified postprocedural states: Secondary | ICD-10-CM | POA: Diagnosis not present

## 2019-10-20 DIAGNOSIS — Z85038 Personal history of other malignant neoplasm of large intestine: Secondary | ICD-10-CM | POA: Diagnosis not present

## 2019-10-20 DIAGNOSIS — Z1211 Encounter for screening for malignant neoplasm of colon: Secondary | ICD-10-CM | POA: Diagnosis not present

## 2019-11-04 DIAGNOSIS — L570 Actinic keratosis: Secondary | ICD-10-CM | POA: Diagnosis not present

## 2019-11-04 DIAGNOSIS — D485 Neoplasm of uncertain behavior of skin: Secondary | ICD-10-CM | POA: Diagnosis not present

## 2019-11-04 DIAGNOSIS — D1801 Hemangioma of skin and subcutaneous tissue: Secondary | ICD-10-CM | POA: Diagnosis not present

## 2019-11-17 DIAGNOSIS — H2513 Age-related nuclear cataract, bilateral: Secondary | ICD-10-CM | POA: Diagnosis not present

## 2019-11-17 DIAGNOSIS — H5213 Myopia, bilateral: Secondary | ICD-10-CM | POA: Diagnosis not present

## 2019-11-17 DIAGNOSIS — Z0100 Encounter for examination of eyes and vision without abnormal findings: Secondary | ICD-10-CM | POA: Diagnosis not present

## 2019-11-17 DIAGNOSIS — H52223 Regular astigmatism, bilateral: Secondary | ICD-10-CM | POA: Diagnosis not present

## 2019-11-17 DIAGNOSIS — H524 Presbyopia: Secondary | ICD-10-CM | POA: Diagnosis not present

## 2019-11-17 DIAGNOSIS — H43393 Other vitreous opacities, bilateral: Secondary | ICD-10-CM | POA: Diagnosis not present

## 2019-12-11 DIAGNOSIS — N3001 Acute cystitis with hematuria: Secondary | ICD-10-CM | POA: Diagnosis not present

## 2019-12-11 DIAGNOSIS — R110 Nausea: Secondary | ICD-10-CM | POA: Diagnosis not present

## 2019-12-11 DIAGNOSIS — Z1331 Encounter for screening for depression: Secondary | ICD-10-CM | POA: Diagnosis not present

## 2019-12-11 DIAGNOSIS — Z6826 Body mass index (BMI) 26.0-26.9, adult: Secondary | ICD-10-CM | POA: Diagnosis not present

## 2019-12-11 DIAGNOSIS — Z9181 History of falling: Secondary | ICD-10-CM | POA: Diagnosis not present

## 2019-12-11 DIAGNOSIS — Z85048 Personal history of other malignant neoplasm of rectum, rectosigmoid junction, and anus: Secondary | ICD-10-CM | POA: Diagnosis not present

## 2019-12-11 DIAGNOSIS — R109 Unspecified abdominal pain: Secondary | ICD-10-CM | POA: Diagnosis not present

## 2019-12-11 DIAGNOSIS — K589 Irritable bowel syndrome without diarrhea: Secondary | ICD-10-CM | POA: Diagnosis not present

## 2020-01-02 DIAGNOSIS — L570 Actinic keratosis: Secondary | ICD-10-CM | POA: Diagnosis not present

## 2020-01-02 DIAGNOSIS — D485 Neoplasm of uncertain behavior of skin: Secondary | ICD-10-CM | POA: Diagnosis not present

## 2020-01-02 DIAGNOSIS — L858 Other specified epidermal thickening: Secondary | ICD-10-CM | POA: Diagnosis not present

## 2020-01-12 DIAGNOSIS — K589 Irritable bowel syndrome without diarrhea: Secondary | ICD-10-CM | POA: Diagnosis not present

## 2020-01-12 DIAGNOSIS — Z6826 Body mass index (BMI) 26.0-26.9, adult: Secondary | ICD-10-CM | POA: Diagnosis not present

## 2020-01-12 DIAGNOSIS — M545 Low back pain: Secondary | ICD-10-CM | POA: Diagnosis not present

## 2020-02-24 DIAGNOSIS — Z6826 Body mass index (BMI) 26.0-26.9, adult: Secondary | ICD-10-CM | POA: Diagnosis not present

## 2020-02-24 DIAGNOSIS — M545 Low back pain: Secondary | ICD-10-CM | POA: Diagnosis not present

## 2020-02-24 DIAGNOSIS — K589 Irritable bowel syndrome without diarrhea: Secondary | ICD-10-CM | POA: Diagnosis not present

## 2020-03-08 DIAGNOSIS — M545 Low back pain: Secondary | ICD-10-CM | POA: Diagnosis not present

## 2020-03-08 DIAGNOSIS — M9902 Segmental and somatic dysfunction of thoracic region: Secondary | ICD-10-CM | POA: Diagnosis not present

## 2020-03-08 DIAGNOSIS — M9905 Segmental and somatic dysfunction of pelvic region: Secondary | ICD-10-CM | POA: Diagnosis not present

## 2020-03-08 DIAGNOSIS — M9903 Segmental and somatic dysfunction of lumbar region: Secondary | ICD-10-CM | POA: Diagnosis not present

## 2020-05-04 DIAGNOSIS — Z20822 Contact with and (suspected) exposure to covid-19: Secondary | ICD-10-CM | POA: Diagnosis not present

## 2020-05-10 DIAGNOSIS — H6591 Unspecified nonsuppurative otitis media, right ear: Secondary | ICD-10-CM | POA: Diagnosis not present

## 2020-05-10 DIAGNOSIS — Z6826 Body mass index (BMI) 26.0-26.9, adult: Secondary | ICD-10-CM | POA: Diagnosis not present

## 2020-05-10 DIAGNOSIS — J301 Allergic rhinitis due to pollen: Secondary | ICD-10-CM | POA: Diagnosis not present

## 2020-06-04 DIAGNOSIS — M545 Low back pain: Secondary | ICD-10-CM | POA: Diagnosis not present

## 2020-06-04 DIAGNOSIS — M9905 Segmental and somatic dysfunction of pelvic region: Secondary | ICD-10-CM | POA: Diagnosis not present

## 2020-06-04 DIAGNOSIS — M9902 Segmental and somatic dysfunction of thoracic region: Secondary | ICD-10-CM | POA: Diagnosis not present

## 2020-06-04 DIAGNOSIS — M9903 Segmental and somatic dysfunction of lumbar region: Secondary | ICD-10-CM | POA: Diagnosis not present

## 2020-06-28 ENCOUNTER — Ambulatory Visit: Payer: Medicare HMO | Admitting: Obstetrics and Gynecology

## 2020-06-30 DIAGNOSIS — R69 Illness, unspecified: Secondary | ICD-10-CM | POA: Diagnosis not present

## 2020-07-05 DIAGNOSIS — M9902 Segmental and somatic dysfunction of thoracic region: Secondary | ICD-10-CM | POA: Diagnosis not present

## 2020-07-05 DIAGNOSIS — M9905 Segmental and somatic dysfunction of pelvic region: Secondary | ICD-10-CM | POA: Diagnosis not present

## 2020-07-05 DIAGNOSIS — M5451 Vertebrogenic low back pain: Secondary | ICD-10-CM | POA: Diagnosis not present

## 2020-07-05 DIAGNOSIS — M9903 Segmental and somatic dysfunction of lumbar region: Secondary | ICD-10-CM | POA: Diagnosis not present

## 2020-07-12 DIAGNOSIS — R69 Illness, unspecified: Secondary | ICD-10-CM | POA: Diagnosis not present

## 2020-07-14 ENCOUNTER — Ambulatory Visit: Payer: Medicare HMO | Admitting: Obstetrics and Gynecology

## 2020-07-20 DIAGNOSIS — Z1331 Encounter for screening for depression: Secondary | ICD-10-CM | POA: Diagnosis not present

## 2020-07-20 DIAGNOSIS — Z6826 Body mass index (BMI) 26.0-26.9, adult: Secondary | ICD-10-CM | POA: Diagnosis not present

## 2020-07-20 DIAGNOSIS — Z13220 Encounter for screening for lipoid disorders: Secondary | ICD-10-CM | POA: Diagnosis not present

## 2020-07-20 DIAGNOSIS — Z0000 Encounter for general adult medical examination without abnormal findings: Secondary | ICD-10-CM | POA: Diagnosis not present

## 2020-07-20 DIAGNOSIS — Z1339 Encounter for screening examination for other mental health and behavioral disorders: Secondary | ICD-10-CM | POA: Diagnosis not present

## 2020-07-20 DIAGNOSIS — J301 Allergic rhinitis due to pollen: Secondary | ICD-10-CM | POA: Diagnosis not present

## 2020-07-20 DIAGNOSIS — Z85048 Personal history of other malignant neoplasm of rectum, rectosigmoid junction, and anus: Secondary | ICD-10-CM | POA: Diagnosis not present

## 2020-07-20 DIAGNOSIS — Z131 Encounter for screening for diabetes mellitus: Secondary | ICD-10-CM | POA: Diagnosis not present

## 2020-07-29 DIAGNOSIS — R69 Illness, unspecified: Secondary | ICD-10-CM | POA: Diagnosis not present

## 2020-08-05 IMAGING — MG DIGITAL DIAGNOSTIC BILAT W/ TOMO W/ CAD
6 of 10 series · 6 of 26 positions shown · non-contrast
Comparison: Previous exam(s).

CLINICAL DATA: One year interval follow-up of likely benign RIGHT
breast calcifications.Annual evaluation, LEFT breast.

EXAM:
DIGITAL DIAGNOSTIC BILATERAL MAMMOGRAM WITH CAD AND TOMO

[L CC]
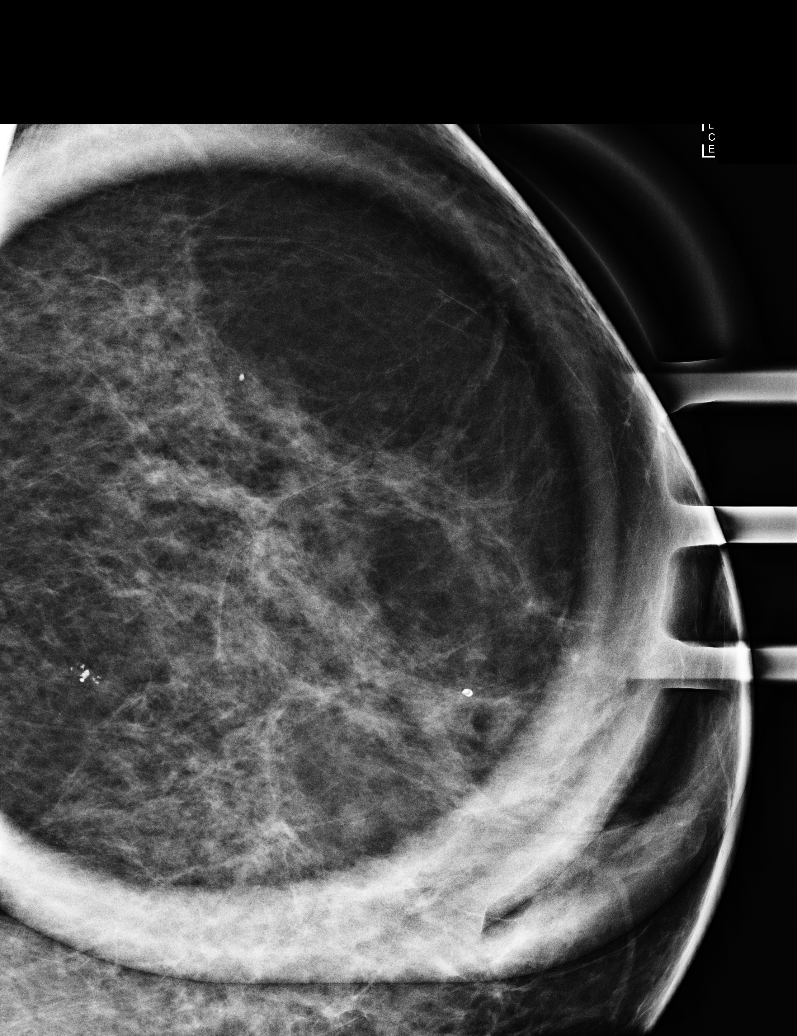

[L ML]
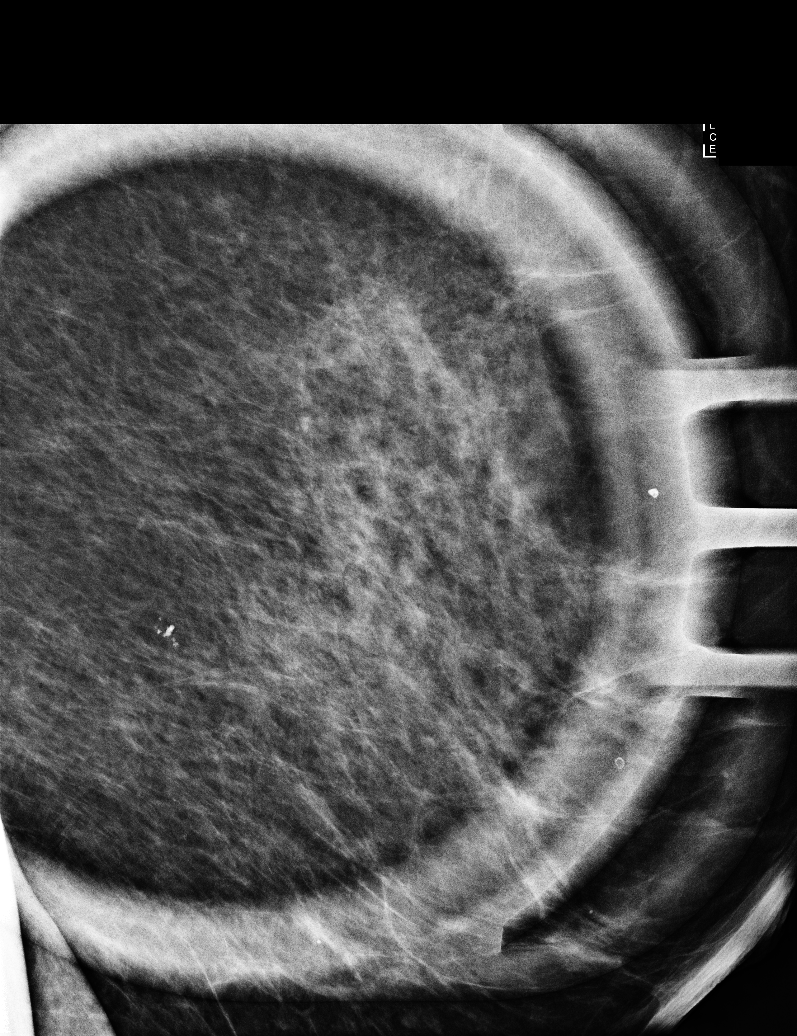

[L MLO synth-2D]
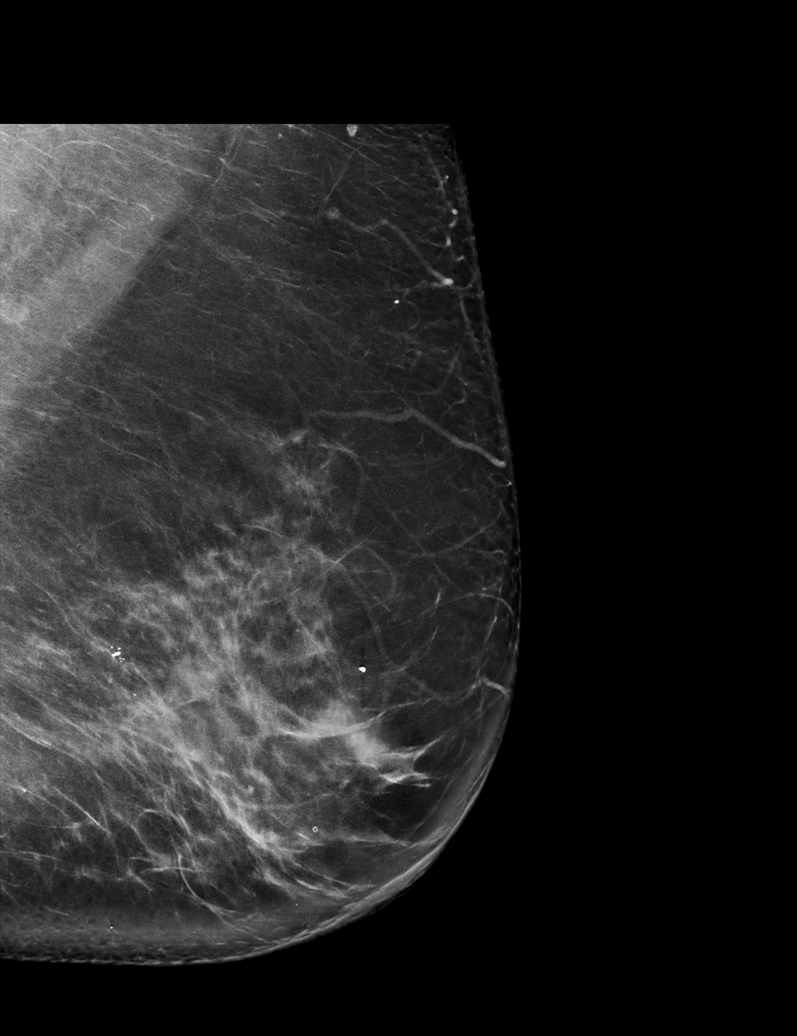

[R MLO synth-2D]
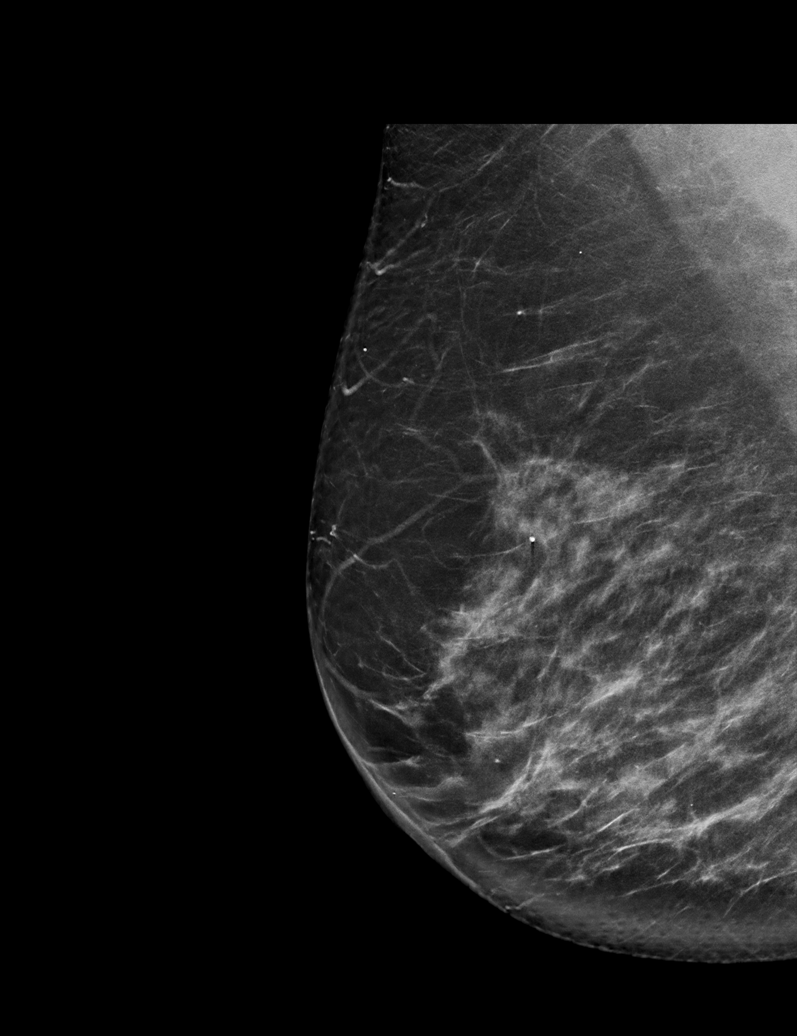

[L CC synth-2D]
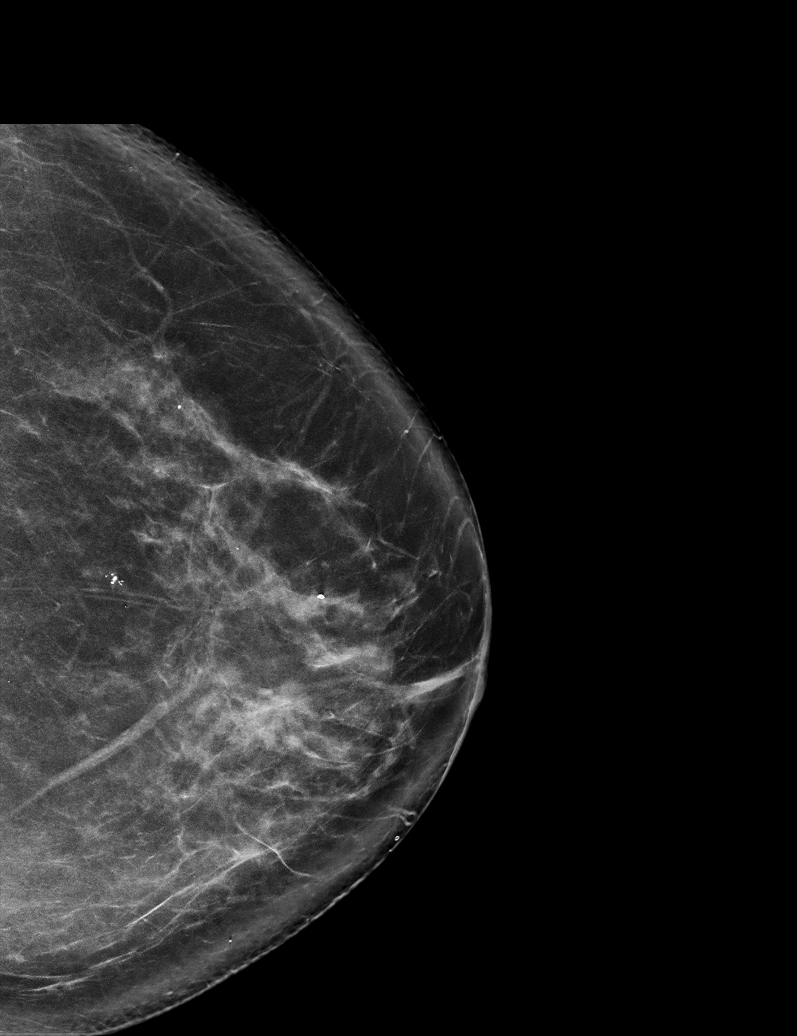

[R CC synth-2D]
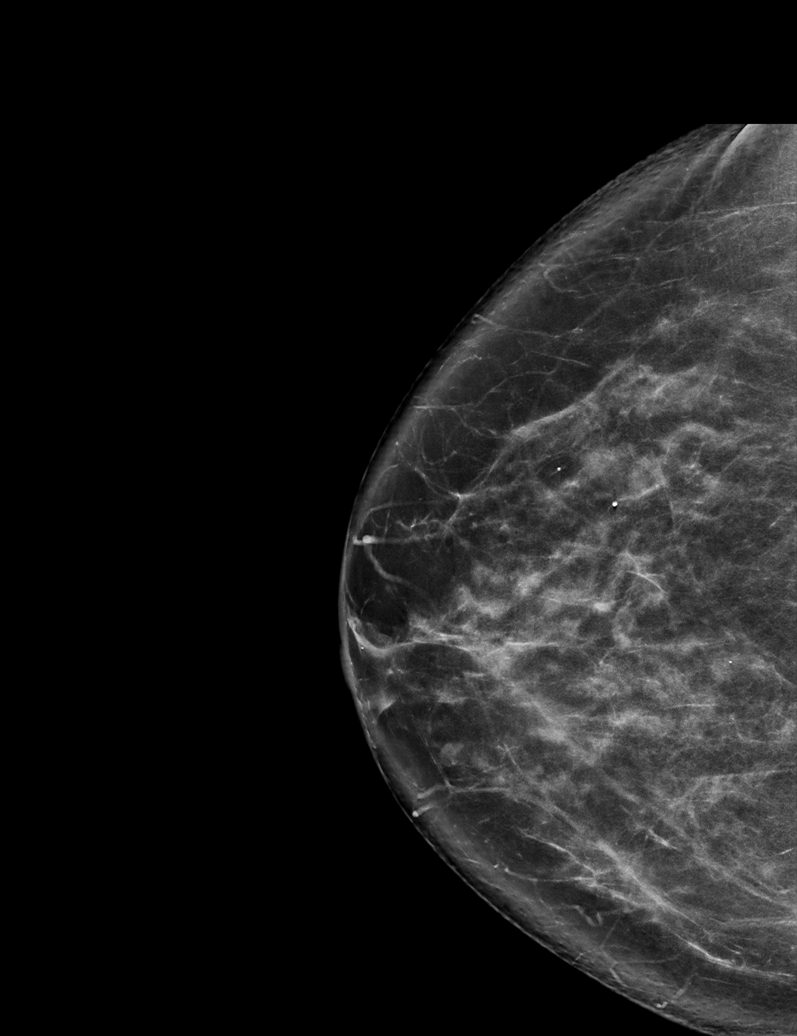

[6 of 26 positions shown; findings below may reference images not displayed]

ACR Breast Density Category c: The breast tissue is heterogeneously
dense, which may obscure small masses.
FINDINGS: Tomosynthesis and synthesized full field CC and MLO views of both
breasts were obtained. Standard spot magnification CC and
mediolateral views of the LEFT breast calcifications were also
obtained.

The 4 mm group of likely benign dystrophic calcifications in the
retroareolar LEFT breast at POSTERIOR depth are unchanged since the
examination 1 year ago. There are no new suspicious linear or
branching forms. No new or suspicious findings elsewhere in the LEFT
breast.

No findings suspicious for malignancy in the RIGHT breast.

Mammographic images were processed with CAD.
IMPRESSION: 1. Stable likely benign 4 mm group of calcifications involving the
retroareolar LEFT breast at POSTERIOR depth dating back to August 2018.
2. No mammographic evidence of malignancy involving the RIGHT
breast.

RECOMMENDATION:
BILATERAL diagnostic mammography in 1 year (in order to confirm 2
years of stability of the likely benign LEFT breast calcifications).

I have discussed the findings and recommendations with the patient.
If applicable, a reminder letter will be sent to the patient
regarding the next appointment.

BI-RADS CATEGORY  3: Probably benign.

## 2020-08-09 DIAGNOSIS — R69 Illness, unspecified: Secondary | ICD-10-CM | POA: Diagnosis not present

## 2020-09-01 ENCOUNTER — Telehealth: Payer: Self-pay

## 2020-09-01 DIAGNOSIS — M9902 Segmental and somatic dysfunction of thoracic region: Secondary | ICD-10-CM | POA: Diagnosis not present

## 2020-09-01 DIAGNOSIS — R921 Mammographic calcification found on diagnostic imaging of breast: Secondary | ICD-10-CM

## 2020-09-01 DIAGNOSIS — M9905 Segmental and somatic dysfunction of pelvic region: Secondary | ICD-10-CM | POA: Diagnosis not present

## 2020-09-01 DIAGNOSIS — M5451 Vertebrogenic low back pain: Secondary | ICD-10-CM | POA: Diagnosis not present

## 2020-09-01 DIAGNOSIS — M9903 Segmental and somatic dysfunction of lumbar region: Secondary | ICD-10-CM | POA: Diagnosis not present

## 2020-09-01 NOTE — Telephone Encounter (Signed)
Please order her diagnostic breast imaging.

## 2020-09-01 NOTE — Telephone Encounter (Signed)
Order placed for bilateral Dx MMG and left breast US at [facility].  Call placed to patient to notify. Patient will contact [facility] directly to schedule.  Patient verbalizes understanding and is agreeable.   Patient is in [facility] recall.   Encounter closed.

## 2020-09-01 NOTE — Telephone Encounter (Signed)
Patient is calling in regards to needing an order for mammogram.

## 2020-09-01 NOTE — Telephone Encounter (Signed)
Spoke with pt. Pt needing dx MMG order per last MMG. See below.  Advised will review with Dr Talbert Nan and return call.   Routing to Dr Talbert Nan. Please advise, ok to have Dx bil MMG order or does pt need OV first?  Per last screening MMG from 09/15/2019: RECOMMENDATION: BILATERAL diagnostic mammography in 1 year (in order to confirm 2 years of stability of the likely benign LEFT breast calcifications).  I have discussed the findings and recommendations with the patient. If applicable, a reminder letter will be sent to the patient regarding the next appointment.  BI-RADS CATEGORY  3: Probably benign.

## 2020-09-08 DIAGNOSIS — M5451 Vertebrogenic low back pain: Secondary | ICD-10-CM | POA: Diagnosis not present

## 2020-09-08 DIAGNOSIS — M9905 Segmental and somatic dysfunction of pelvic region: Secondary | ICD-10-CM | POA: Diagnosis not present

## 2020-09-08 DIAGNOSIS — M9903 Segmental and somatic dysfunction of lumbar region: Secondary | ICD-10-CM | POA: Diagnosis not present

## 2020-09-08 DIAGNOSIS — M9902 Segmental and somatic dysfunction of thoracic region: Secondary | ICD-10-CM | POA: Diagnosis not present

## 2020-09-13 DIAGNOSIS — Z1231 Encounter for screening mammogram for malignant neoplasm of breast: Secondary | ICD-10-CM

## 2020-09-22 DIAGNOSIS — M9903 Segmental and somatic dysfunction of lumbar region: Secondary | ICD-10-CM | POA: Diagnosis not present

## 2020-09-22 DIAGNOSIS — M9905 Segmental and somatic dysfunction of pelvic region: Secondary | ICD-10-CM | POA: Diagnosis not present

## 2020-09-22 DIAGNOSIS — M5451 Vertebrogenic low back pain: Secondary | ICD-10-CM | POA: Diagnosis not present

## 2020-09-22 DIAGNOSIS — M9902 Segmental and somatic dysfunction of thoracic region: Secondary | ICD-10-CM | POA: Diagnosis not present

## 2020-10-13 ENCOUNTER — Ambulatory Visit: Payer: Medicare HMO

## 2020-10-13 ENCOUNTER — Ambulatory Visit
Admission: RE | Admit: 2020-10-13 | Discharge: 2020-10-13 | Disposition: A | Discharge status: Home / Self Care | Payer: Medicare HMO | Source: Ambulatory Visit | Attending: Obstetrics and Gynecology | Admitting: Obstetrics and Gynecology

## 2020-10-13 ENCOUNTER — Other Ambulatory Visit: Payer: Self-pay

## 2020-10-13 DIAGNOSIS — R921 Mammographic calcification found on diagnostic imaging of breast: Secondary | ICD-10-CM

## 2020-11-04 DIAGNOSIS — L814 Other melanin hyperpigmentation: Secondary | ICD-10-CM | POA: Diagnosis not present

## 2020-11-04 DIAGNOSIS — L300 Nummular dermatitis: Secondary | ICD-10-CM | POA: Diagnosis not present

## 2020-11-04 DIAGNOSIS — D1801 Hemangioma of skin and subcutaneous tissue: Secondary | ICD-10-CM | POA: Diagnosis not present

## 2020-11-04 DIAGNOSIS — B354 Tinea corporis: Secondary | ICD-10-CM | POA: Diagnosis not present

## 2020-11-04 DIAGNOSIS — D225 Melanocytic nevi of trunk: Secondary | ICD-10-CM | POA: Diagnosis not present

## 2020-12-01 DIAGNOSIS — H5213 Myopia, bilateral: Secondary | ICD-10-CM | POA: Diagnosis not present

## 2020-12-01 DIAGNOSIS — Z0100 Encounter for examination of eyes and vision without abnormal findings: Secondary | ICD-10-CM | POA: Diagnosis not present

## 2020-12-06 ENCOUNTER — Other Ambulatory Visit: Payer: Self-pay

## 2020-12-06 ENCOUNTER — Ambulatory Visit: Payer: Medicare HMO

## 2020-12-06 ENCOUNTER — Ambulatory Visit
Admission: RE | Admit: 2020-12-06 | Discharge: 2020-12-06 | Disposition: A | Discharge status: Home / Self Care | Payer: Medicare HMO | Source: Ambulatory Visit | Attending: Obstetrics and Gynecology | Admitting: Obstetrics and Gynecology

## 2020-12-06 DIAGNOSIS — R921 Mammographic calcification found on diagnostic imaging of breast: Secondary | ICD-10-CM | POA: Diagnosis not present

## 2020-12-30 ENCOUNTER — Ambulatory Visit: Payer: Medicare HMO | Admitting: Obstetrics and Gynecology

## 2021-01-04 ENCOUNTER — Ambulatory Visit: Payer: Medicare HMO | Admitting: Obstetrics and Gynecology

## 2021-02-01 DIAGNOSIS — Z131 Encounter for screening for diabetes mellitus: Secondary | ICD-10-CM | POA: Diagnosis not present

## 2021-02-18 DIAGNOSIS — Z9181 History of falling: Secondary | ICD-10-CM | POA: Diagnosis not present

## 2021-02-18 DIAGNOSIS — Z6827 Body mass index (BMI) 27.0-27.9, adult: Secondary | ICD-10-CM | POA: Diagnosis not present

## 2021-02-18 DIAGNOSIS — E785 Hyperlipidemia, unspecified: Secondary | ICD-10-CM | POA: Diagnosis not present

## 2021-11-03 NOTE — Progress Notes (Signed)
66 y.o. [id number] Married White or Caucasian Not Hispanic or Latino female here for annual exam.  No vaginal bleeding. No bladder c/o.    H/O rectal cancer with lung mets in her mid 62's, in remission. S/P colon resection, radiation and chemotherapy. Long term issues with diarrhea and fecal incontinence. H/O colon perforation during colonoscopy in 2012, needed surgery.  No LMP recorded. Patient is postmenopausal.          Sexually active: No.  The current method of family planning is post menopausal status.    Exercising: Yes.     walking Smoker:  no  Health Maintenance: Pap:    04/03/2019 WNL NEG HPV, 02/28/2017 normal with negative HPV History of abnormal Pap:  no MMG:  12/06/20 density C Bi-rads 2 benign  BMD:   06/26/19 osteopenic,  T score -1.6, FRAX 9.3/1.1% Colonoscopy: 2017 repeat in 3 years, difficult secondary to scar tissue, h/o colon perforation in 2012 (needed surgery). CT colonography in 2021  TDaP:  UTD per patient Gardasil: n/a   reports that she has never smoked. She has never used smokeless tobacco. She reports current alcohol use of about 7.0 standard drinks per week. She reports that she does not use drugs. 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 Would like to retire.  Son is the Educational psychologist for video at the school of music at [employer].   Past Medical History:  Diagnosis Date   Allergy    Asthma    Cancer ([place])    Rectal cancer with lung met - in remission x 18 years.   Osteopenia    Perforated sigmoid colon ([place])     Past Surgical History:  Procedure Laterality Date   APPENDECTOMY     COLON RESECTION     COLON SURGERY     DILATION AND CURETTAGE OF UTERUS     LUNG REMOVAL, PARTIAL      Current Outpatient Medications  Medication Sig Dispense Refill   levocetirizine (XYZAL) 2.5 MG/5ML solution Take 2.5 mg by mouth every evening.     No current facility-administered medications for this visit.    Family History  Problem Relation Age of Onset    COPD Mother    Osteoporosis Mother    Rectal cancer Father    Breast cancer Maternal Grandmother     Review of Systems  All other systems reviewed and are negative.  Exam:   BP 122/74 (BP Location: Left Arm, Patient Position: Sitting, Cuff Size: Normal)    Pulse 82    Resp 12    Ht 5' 6.5" (1.689 m)    Wt 171 lb (77.6 kg)    BMI 27.19 kg/m   Weight change: $RemoveBefore'@WEIGHTCHANGE'LDmunRGVXRWqS$ @ Height:   Height: 5' 6.5" (168.9 cm)  Ht Readings from Last 3 Encounters:  11/14/21 5' 6.5" (1.689 m)  07/14/19 5' 6.54" (1.69 m)  06/27/18 $RemoveB'5\' 7"'YYtCPMAU$  (1.702 m)    General appearance: alert, cooperative and appears stated age Head: Normocephalic, without obvious abnormality, atraumatic Neck: no adenopathy, supple, symmetrical, trachea midline and thyroid normal to inspection and palpation Lungs: clear to auscultation bilaterally Cardiovascular: regular rate and rhythm Breasts: normal appearance, no masses or tenderness Extremities: extremities normal, atraumatic, no cyanosis or edema Skin: Skin color, texture, turgor normal. No rashes or lesions Lymph nodes: Cervical, supraclavicular, and axillary nodes normal. No abnormal inguinal nodes palpated Neurologic: Grossly normal   Pelvic: External genitalia:  no lesions              Urethra:  normal appearing urethra with no masses, tenderness or lesions              Bartholins and Skenes: normal                 Vagina: normal appearing vagina with normal color and discharge, no lesions              Cervix: no lesions               Bimanual Exam:  Uterus:   no masses or tenderness              Adnexa: no mass, fullness, tenderness               Rectovaginal: Confirms               Anus:  normal sphincter tone, no lesions  Karmen Bongo, RN chaperoned for the exam.  1. Gynecologic exam normal Discussed breast self exam Discussed calcium and vit D intake Pap in 2 years CT colonography UTD (can't do colonoscopies)  2. History of osteopenia - DG Bone Density;  Future  3. Hypoestrogenism - DG Bone Density; Future

## 2021-11-04 DIAGNOSIS — D2239 Melanocytic nevi of other parts of face: Secondary | ICD-10-CM | POA: Diagnosis not present

## 2021-11-04 DIAGNOSIS — L814 Other melanin hyperpigmentation: Secondary | ICD-10-CM | POA: Diagnosis not present

## 2021-11-04 DIAGNOSIS — D225 Melanocytic nevi of trunk: Secondary | ICD-10-CM | POA: Diagnosis not present

## 2021-11-08 DIAGNOSIS — E785 Hyperlipidemia, unspecified: Secondary | ICD-10-CM | POA: Diagnosis not present

## 2021-11-08 DIAGNOSIS — Z131 Encounter for screening for diabetes mellitus: Secondary | ICD-10-CM | POA: Diagnosis not present

## 2021-11-14 ENCOUNTER — Other Ambulatory Visit: Payer: Self-pay

## 2021-11-14 ENCOUNTER — Ambulatory Visit (INDEPENDENT_AMBULATORY_CARE_PROVIDER_SITE_OTHER): Payer: Medicare HMO | Admitting: Obstetrics and Gynecology

## 2021-11-14 ENCOUNTER — Encounter: Payer: Self-pay | Admitting: Obstetrics and Gynecology

## 2021-11-14 VITALS — BP 122/74 | HR 82 | Resp 12 | Ht 66.5 in | Wt 171.0 lb

## 2021-11-14 DIAGNOSIS — Z1331 Encounter for screening for depression: Secondary | ICD-10-CM | POA: Diagnosis not present

## 2021-11-14 DIAGNOSIS — Z85048 Personal history of other malignant neoplasm of rectum, rectosigmoid junction, and anus: Secondary | ICD-10-CM | POA: Diagnosis not present

## 2021-11-14 DIAGNOSIS — Z01419 Encounter for gynecological examination (general) (routine) without abnormal findings: Secondary | ICD-10-CM

## 2021-11-14 DIAGNOSIS — K589 Irritable bowel syndrome without diarrhea: Secondary | ICD-10-CM | POA: Diagnosis not present

## 2021-11-14 DIAGNOSIS — E2839 Other primary ovarian failure: Secondary | ICD-10-CM

## 2021-11-14 DIAGNOSIS — Z8739 Personal history of other diseases of the musculoskeletal system and connective tissue: Secondary | ICD-10-CM

## 2021-11-14 DIAGNOSIS — E785 Hyperlipidemia, unspecified: Secondary | ICD-10-CM | POA: Diagnosis not present

## 2021-11-14 DIAGNOSIS — Z0000 Encounter for general adult medical examination without abnormal findings: Secondary | ICD-10-CM | POA: Diagnosis not present

## 2021-11-14 DIAGNOSIS — Z6827 Body mass index (BMI) 27.0-27.9, adult: Secondary | ICD-10-CM | POA: Diagnosis not present

## 2021-11-14 NOTE — Patient Instructions (Signed)

## 2021-11-24 ENCOUNTER — Other Ambulatory Visit: Payer: Self-pay | Admitting: Obstetrics and Gynecology

## 2021-11-24 DIAGNOSIS — Z1231 Encounter for screening mammogram for malignant neoplasm of breast: Secondary | ICD-10-CM

## 2021-12-12 ENCOUNTER — Other Ambulatory Visit: Payer: Self-pay

## 2021-12-12 ENCOUNTER — Ambulatory Visit
Admission: RE | Admit: 2021-12-12 | Discharge: 2021-12-12 | Disposition: A | Discharge status: Home / Self Care | Payer: Medicare HMO | Source: Ambulatory Visit | Attending: Obstetrics and Gynecology | Admitting: Obstetrics and Gynecology

## 2021-12-12 DIAGNOSIS — Z1231 Encounter for screening mammogram for malignant neoplasm of breast: Secondary | ICD-10-CM

## 2021-12-27 DIAGNOSIS — Z85048 Personal history of other malignant neoplasm of rectum, rectosigmoid junction, and anus: Secondary | ICD-10-CM | POA: Diagnosis not present

## 2021-12-27 DIAGNOSIS — K921 Melena: Secondary | ICD-10-CM | POA: Diagnosis not present

## 2021-12-27 DIAGNOSIS — K5900 Constipation, unspecified: Secondary | ICD-10-CM | POA: Diagnosis not present

## 2021-12-27 DIAGNOSIS — R1030 Lower abdominal pain, unspecified: Secondary | ICD-10-CM | POA: Diagnosis not present

## 2021-12-27 DIAGNOSIS — Z6827 Body mass index (BMI) 27.0-27.9, adult: Secondary | ICD-10-CM | POA: Diagnosis not present

## 2021-12-29 DIAGNOSIS — Z6827 Body mass index (BMI) 27.0-27.9, adult: Secondary | ICD-10-CM | POA: Diagnosis not present

## 2021-12-29 DIAGNOSIS — R1030 Lower abdominal pain, unspecified: Secondary | ICD-10-CM | POA: Diagnosis not present

## 2021-12-29 DIAGNOSIS — K5900 Constipation, unspecified: Secondary | ICD-10-CM | POA: Diagnosis not present

## 2021-12-29 DIAGNOSIS — K921 Melena: Secondary | ICD-10-CM | POA: Diagnosis not present

## 2022-01-24 DIAGNOSIS — Z85038 Personal history of other malignant neoplasm of large intestine: Secondary | ICD-10-CM | POA: Diagnosis not present

## 2022-03-09 DIAGNOSIS — K625 Hemorrhage of anus and rectum: Secondary | ICD-10-CM | POA: Diagnosis not present

## 2022-03-09 DIAGNOSIS — K7689 Other specified diseases of liver: Secondary | ICD-10-CM | POA: Diagnosis not present

## 2022-03-09 DIAGNOSIS — Z85038 Personal history of other malignant neoplasm of large intestine: Secondary | ICD-10-CM | POA: Diagnosis not present

## 2022-03-09 DIAGNOSIS — Z08 Encounter for follow-up examination after completed treatment for malignant neoplasm: Secondary | ICD-10-CM | POA: Diagnosis not present

## 2022-05-31 DIAGNOSIS — H5213 Myopia, bilateral: Secondary | ICD-10-CM | POA: Diagnosis not present

## 2022-05-31 DIAGNOSIS — Z0100 Encounter for examination of eyes and vision without abnormal findings: Secondary | ICD-10-CM | POA: Diagnosis not present

## 2022-06-19 DIAGNOSIS — Z6826 Body mass index (BMI) 26.0-26.9, adult: Secondary | ICD-10-CM | POA: Diagnosis not present

## 2022-06-19 DIAGNOSIS — R0982 Postnasal drip: Secondary | ICD-10-CM | POA: Diagnosis not present

## 2022-06-19 DIAGNOSIS — R490 Dysphonia: Secondary | ICD-10-CM | POA: Diagnosis not present

## 2022-06-19 DIAGNOSIS — J383 Other diseases of vocal cords: Secondary | ICD-10-CM | POA: Diagnosis not present

## 2022-06-19 DIAGNOSIS — K520 Gastroenteritis and colitis due to radiation: Secondary | ICD-10-CM | POA: Diagnosis not present

## 2022-06-19 DIAGNOSIS — J309 Allergic rhinitis, unspecified: Secondary | ICD-10-CM | POA: Diagnosis not present

## 2022-06-27 ENCOUNTER — Telehealth: Payer: Self-pay | Admitting: Gastroenterology

## 2022-06-27 NOTE — Telephone Encounter (Signed)
Good Morning Dr. Lyndel Safe,  Patient called stating that she has a referral in from her PCP Dr. Serita Grammes for Radiation colitis. Patient stated that she is wanting to transfer her care to you from [facility]. Patient was last seen by [facility] in 2020 for a telemedicine visit. Patients records are in epic, will you please review and advise on scheduling?   Thank you.

## 2022-07-06 NOTE — Telephone Encounter (Signed)
I have reviewed [facility]'s notes and recommendations CT colography has been performed already For now, continue care with [facility]

## 2022-07-21 NOTE — Telephone Encounter (Signed)
Called patient and left detailed msg on Dr recommendations.

## 2022-10-08 DIAGNOSIS — Z85038 Personal history of other malignant neoplasm of large intestine: Secondary | ICD-10-CM | POA: Diagnosis not present

## 2022-10-08 DIAGNOSIS — R110 Nausea: Secondary | ICD-10-CM | POA: Diagnosis not present

## 2022-10-08 DIAGNOSIS — K625 Hemorrhage of anus and rectum: Secondary | ICD-10-CM | POA: Diagnosis not present

## 2022-10-08 DIAGNOSIS — K627 Radiation proctitis: Secondary | ICD-10-CM | POA: Diagnosis not present

## 2022-10-08 DIAGNOSIS — R109 Unspecified abdominal pain: Secondary | ICD-10-CM | POA: Diagnosis not present

## 2022-10-08 DIAGNOSIS — K6289 Other specified diseases of anus and rectum: Secondary | ICD-10-CM | POA: Diagnosis not present

## 2022-10-15 DIAGNOSIS — Z85048 Personal history of other malignant neoplasm of rectum, rectosigmoid junction, and anus: Secondary | ICD-10-CM | POA: Diagnosis not present

## 2022-10-15 DIAGNOSIS — Z9089 Acquired absence of other organs: Secondary | ICD-10-CM | POA: Diagnosis not present

## 2022-10-15 DIAGNOSIS — Z85038 Personal history of other malignant neoplasm of large intestine: Secondary | ICD-10-CM | POA: Diagnosis not present

## 2022-10-15 DIAGNOSIS — R1032 Left lower quadrant pain: Secondary | ICD-10-CM | POA: Diagnosis not present

## 2022-10-15 DIAGNOSIS — K5900 Constipation, unspecified: Secondary | ICD-10-CM | POA: Diagnosis not present

## 2022-10-15 DIAGNOSIS — Y842 Radiological procedure and radiotherapy as the cause of abnormal reaction of the patient, or of later complication, without mention of misadventure at the time of the procedure: Secondary | ICD-10-CM | POA: Diagnosis not present

## 2022-10-15 DIAGNOSIS — K627 Radiation proctitis: Secondary | ICD-10-CM | POA: Diagnosis not present

## 2022-10-15 DIAGNOSIS — R109 Unspecified abdominal pain: Secondary | ICD-10-CM | POA: Diagnosis not present

## 2022-10-15 DIAGNOSIS — R1030 Lower abdominal pain, unspecified: Secondary | ICD-10-CM | POA: Diagnosis not present

## 2022-10-15 DIAGNOSIS — K922 Gastrointestinal hemorrhage, unspecified: Secondary | ICD-10-CM | POA: Diagnosis not present

## 2022-10-15 DIAGNOSIS — R1031 Right lower quadrant pain: Secondary | ICD-10-CM | POA: Diagnosis not present

## 2022-10-16 DIAGNOSIS — K5900 Constipation, unspecified: Secondary | ICD-10-CM | POA: Diagnosis not present

## 2022-10-27 DIAGNOSIS — K627 Radiation proctitis: Secondary | ICD-10-CM | POA: Diagnosis not present

## 2022-10-27 DIAGNOSIS — Z85038 Personal history of other malignant neoplasm of large intestine: Secondary | ICD-10-CM | POA: Diagnosis not present

## 2022-10-27 DIAGNOSIS — K625 Hemorrhage of anus and rectum: Secondary | ICD-10-CM | POA: Diagnosis not present

## 2022-10-27 DIAGNOSIS — C7800 Secondary malignant neoplasm of unspecified lung: Secondary | ICD-10-CM | POA: Diagnosis not present

## 2022-10-30 ENCOUNTER — Other Ambulatory Visit: Payer: Self-pay | Admitting: Family Medicine

## 2022-10-30 DIAGNOSIS — Z1231 Encounter for screening mammogram for malignant neoplasm of breast: Secondary | ICD-10-CM

## 2022-11-14 DIAGNOSIS — E785 Hyperlipidemia, unspecified: Secondary | ICD-10-CM | POA: Diagnosis not present

## 2022-11-21 DIAGNOSIS — Z0000 Encounter for general adult medical examination without abnormal findings: Secondary | ICD-10-CM | POA: Diagnosis not present

## 2022-11-21 DIAGNOSIS — K627 Radiation proctitis: Secondary | ICD-10-CM | POA: Diagnosis not present

## 2022-11-21 DIAGNOSIS — Z6826 Body mass index (BMI) 26.0-26.9, adult: Secondary | ICD-10-CM | POA: Diagnosis not present

## 2022-11-21 DIAGNOSIS — Z1331 Encounter for screening for depression: Secondary | ICD-10-CM | POA: Diagnosis not present

## 2022-12-18 ENCOUNTER — Ambulatory Visit
Admission: RE | Admit: 2022-12-18 | Discharge: 2022-12-18 | Disposition: A | Discharge status: Home / Self Care | Payer: Medicare HMO | Source: Ambulatory Visit

## 2022-12-18 DIAGNOSIS — Z1231 Encounter for screening mammogram for malignant neoplasm of breast: Secondary | ICD-10-CM

## 2023-06-12 DIAGNOSIS — Z85038 Personal history of other malignant neoplasm of large intestine: Secondary | ICD-10-CM | POA: Diagnosis not present

## 2023-06-12 DIAGNOSIS — R152 Fecal urgency: Secondary | ICD-10-CM | POA: Diagnosis not present

## 2023-06-19 DIAGNOSIS — Z0100 Encounter for examination of eyes and vision without abnormal findings: Secondary | ICD-10-CM | POA: Diagnosis not present

## 2023-06-19 DIAGNOSIS — H5213 Myopia, bilateral: Secondary | ICD-10-CM | POA: Diagnosis not present

## 2023-11-20 ENCOUNTER — Other Ambulatory Visit: Payer: Self-pay | Admitting: Family Medicine

## 2023-11-20 DIAGNOSIS — Z1231 Encounter for screening mammogram for malignant neoplasm of breast: Secondary | ICD-10-CM

## 2023-12-19 DIAGNOSIS — Z1231 Encounter for screening mammogram for malignant neoplasm of breast: Secondary | ICD-10-CM

## 2023-12-20 ENCOUNTER — Ambulatory Visit

## 2023-12-28 ENCOUNTER — Ambulatory Visit
Admission: RE | Admit: 2023-12-28 | Discharge: 2023-12-28 | Disposition: A | Discharge status: Home / Self Care | Source: Ambulatory Visit | Attending: Family Medicine | Admitting: Family Medicine

## 2023-12-28 DIAGNOSIS — Z1231 Encounter for screening mammogram for malignant neoplasm of breast: Secondary | ICD-10-CM | POA: Diagnosis not present

## 2024-03-17 DIAGNOSIS — L814 Other melanin hyperpigmentation: Secondary | ICD-10-CM | POA: Diagnosis not present

## 2024-03-17 DIAGNOSIS — D1801 Hemangioma of skin and subcutaneous tissue: Secondary | ICD-10-CM | POA: Diagnosis not present

## 2024-03-17 DIAGNOSIS — L821 Other seborrheic keratosis: Secondary | ICD-10-CM | POA: Diagnosis not present

## 2024-03-17 DIAGNOSIS — D2222 Melanocytic nevi of left ear and external auricular canal: Secondary | ICD-10-CM | POA: Diagnosis not present

## 2024-03-17 DIAGNOSIS — L918 Other hypertrophic disorders of the skin: Secondary | ICD-10-CM | POA: Diagnosis not present

## 2024-03-17 DIAGNOSIS — D225 Melanocytic nevi of trunk: Secondary | ICD-10-CM | POA: Diagnosis not present

## 2024-03-17 DIAGNOSIS — L720 Epidermal cyst: Secondary | ICD-10-CM | POA: Diagnosis not present

## 2024-06-27 DIAGNOSIS — C7800 Secondary malignant neoplasm of unspecified lung: Secondary | ICD-10-CM | POA: Diagnosis not present

## 2024-06-27 DIAGNOSIS — C189 Malignant neoplasm of colon, unspecified: Secondary | ICD-10-CM | POA: Diagnosis not present

## 2024-06-27 DIAGNOSIS — R152 Fecal urgency: Secondary | ICD-10-CM | POA: Diagnosis not present

## 2024-06-27 DIAGNOSIS — Z85038 Personal history of other malignant neoplasm of large intestine: Secondary | ICD-10-CM | POA: Diagnosis not present

## 2024-06-27 DIAGNOSIS — K625 Hemorrhage of anus and rectum: Secondary | ICD-10-CM | POA: Diagnosis not present

## 2024-06-30 DIAGNOSIS — H5213 Myopia, bilateral: Secondary | ICD-10-CM | POA: Diagnosis not present

## 2024-06-30 DIAGNOSIS — H524 Presbyopia: Secondary | ICD-10-CM | POA: Diagnosis not present

## 2024-06-30 DIAGNOSIS — Z0100 Encounter for examination of eyes and vision without abnormal findings: Secondary | ICD-10-CM | POA: Diagnosis not present

## 2024-06-30 DIAGNOSIS — H52223 Regular astigmatism, bilateral: Secondary | ICD-10-CM | POA: Diagnosis not present

## 2024-07-24 DIAGNOSIS — Z6827 Body mass index (BMI) 27.0-27.9, adult: Secondary | ICD-10-CM | POA: Diagnosis not present

## 2024-07-24 DIAGNOSIS — Z1331 Encounter for screening for depression: Secondary | ICD-10-CM | POA: Diagnosis not present

## 2024-07-24 DIAGNOSIS — Z0000 Encounter for general adult medical examination without abnormal findings: Secondary | ICD-10-CM | POA: Diagnosis not present

## 2024-07-24 DIAGNOSIS — Z1339 Encounter for screening examination for other mental health and behavioral disorders: Secondary | ICD-10-CM | POA: Diagnosis not present

## 2024-07-24 DIAGNOSIS — Z13820 Encounter for screening for osteoporosis: Secondary | ICD-10-CM | POA: Diagnosis not present

## 2024-07-24 DIAGNOSIS — E785 Hyperlipidemia, unspecified: Secondary | ICD-10-CM | POA: Diagnosis not present
